# Patient Record
Sex: Female | Born: 1985 | Race: White | Hispanic: No | Smoking: Current every day smoker
Health system: Southern US, Community
[De-identification: ages and names within clinical notes are randomized; demographics above are authoritative.]

## PROBLEM LIST (undated history)

## (undated) DIAGNOSIS — M797 Fibromyalgia: Secondary | ICD-10-CM

## (undated) DIAGNOSIS — E079 Disorder of thyroid, unspecified: Secondary | ICD-10-CM

## (undated) DIAGNOSIS — I2699 Other pulmonary embolism without acute cor pulmonale: Secondary | ICD-10-CM

## (undated) DIAGNOSIS — F1910 Other psychoactive substance abuse, uncomplicated: Secondary | ICD-10-CM

## (undated) DIAGNOSIS — E063 Autoimmune thyroiditis: Secondary | ICD-10-CM

## (undated) DIAGNOSIS — I429 Cardiomyopathy, unspecified: Secondary | ICD-10-CM

## (undated) HISTORY — PX: TONSILLECTOMY: SUR1361

---
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
## 2004-01-05 ENCOUNTER — Emergency Department (HOSPITAL_COMMUNITY): Admission: EM | Admit: 2004-01-05 | Discharge: 2004-01-05 | Payer: Self-pay

## 2004-09-19 ENCOUNTER — Emergency Department (HOSPITAL_COMMUNITY): Admission: EM | Admit: 2004-09-19 | Discharge: 2004-09-19 | Payer: Self-pay | Admitting: Emergency Medicine

## 2005-12-13 ENCOUNTER — Inpatient Hospital Stay (HOSPITAL_COMMUNITY): Admission: AD | Admit: 2005-12-13 | Discharge: 2005-12-13 | Payer: Self-pay | Admitting: *Deleted

## 2006-05-17 ENCOUNTER — Ambulatory Visit (HOSPITAL_COMMUNITY): Admission: RE | Admit: 2006-05-17 | Discharge: 2006-05-17 | Payer: Self-pay | Admitting: Obstetrics & Gynecology

## 2006-07-05 ENCOUNTER — Inpatient Hospital Stay (HOSPITAL_COMMUNITY): Admission: AD | Admit: 2006-07-05 | Discharge: 2006-07-11 | Payer: Self-pay | Admitting: Obstetrics & Gynecology

## 2006-07-16 ENCOUNTER — Inpatient Hospital Stay (HOSPITAL_COMMUNITY): Admission: AD | Admit: 2006-07-16 | Discharge: 2006-07-23 | Payer: Self-pay | Admitting: Obstetrics

## 2006-07-16 ENCOUNTER — Ambulatory Visit: Payer: Self-pay | Admitting: Emergency Medicine

## 2006-07-16 ENCOUNTER — Ambulatory Visit: Payer: Self-pay | Admitting: Internal Medicine

## 2006-08-30 ENCOUNTER — Emergency Department (HOSPITAL_COMMUNITY): Admission: EM | Admit: 2006-08-30 | Discharge: 2006-08-30 | Payer: Self-pay | Admitting: Emergency Medicine

## 2007-04-29 ENCOUNTER — Emergency Department (HOSPITAL_COMMUNITY): Admission: EM | Admit: 2007-04-29 | Discharge: 2007-04-30 | Payer: Self-pay | Admitting: Emergency Medicine

## 2009-09-26 ENCOUNTER — Ambulatory Visit: Payer: Self-pay | Admitting: Diagnostic Radiology

## 2009-09-26 ENCOUNTER — Emergency Department (HOSPITAL_BASED_OUTPATIENT_CLINIC_OR_DEPARTMENT_OTHER): Admission: EM | Admit: 2009-09-26 | Discharge: 2009-09-26 | Payer: Self-pay | Admitting: Emergency Medicine

## 2009-10-03 ENCOUNTER — Emergency Department (HOSPITAL_BASED_OUTPATIENT_CLINIC_OR_DEPARTMENT_OTHER): Admission: EM | Admit: 2009-10-03 | Discharge: 2009-10-03 | Payer: Self-pay | Admitting: Emergency Medicine

## 2009-10-03 ENCOUNTER — Ambulatory Visit: Payer: Self-pay | Admitting: Diagnostic Radiology

## 2011-03-03 LAB — DIFFERENTIAL
Basophils Absolute: 0.2 10*3/uL — ABNORMAL HIGH (ref 0.0–0.1)
Eosinophils Absolute: 0.2 10*3/uL (ref 0.0–0.7)
Eosinophils Relative: 2 % (ref 0–5)
Lymphocytes Relative: 26 % (ref 12–46)
Lymphs Abs: 2.6 10*3/uL (ref 0.7–4.0)
Monocytes Relative: 9 % (ref 3–12)
Neutro Abs: 6.4 10*3/uL (ref 1.7–7.7)
Neutrophils Relative %: 62 % (ref 43–77)

## 2011-03-03 LAB — POCT CARDIAC MARKERS
CKMB, poc: <1 ng/mL — ABNORMAL LOW (ref 1.0–8.0)
Myoglobin, poc: 70.3 ng/mL (ref 12–200)

## 2011-03-03 LAB — BASIC METABOLIC PANEL
BUN: 9 mg/dL (ref 6–23)
CO2: 23 mEq/L (ref 19–32)
Chloride: 101 mEq/L (ref 96–112)
Creatinine, Ser: 0.9 mg/dL (ref 0.4–1.2)
GFR calc non Af Amer: >60 mL/min (ref >60)
Sodium: 139 mEq/L (ref 135–145)

## 2011-03-03 LAB — CBC
HCT: 43.6 % (ref 36.0–46.0)
Hemoglobin: 14.8 g/dL (ref 12.0–15.0)
RBC: 4.84 MIL/uL (ref 3.87–5.11)
RDW: 11.8 % (ref 11.5–15.5)

## 2011-04-15 NOTE — Discharge Summary (Signed)
Linda [name], [name]             ACCOUNT NO.:  [id number]   MEDICAL RECORD NO.:  [id number]          PATIENT TYPE:  INP   LOCATION:  9304                          FACILITY:  [facility]   PHYSICIAN:  Roseanna Rainbow, M.D.DATE OF BIRTH:  November 04, 1986   DATE OF ADMISSION:  07/16/2006  DATE OF DISCHARGE:  07/23/2006                                 DISCHARGE SUMMARY   IDENTIFYING INFORMATION/JUSTIFICATION FOR ADMISSION AND CARE:  The patient  is a 25 year old Caucasian female, status post a spontaneous vaginal  delivery on July 09, 2006, now with chest discomfort and fatigue.   HISTORY OF PRESENT ILLNESS:  The patient is status post vaginal delivery on  July 09, 2006, in the setting of mild PIH. She was recently seen in the  office on July 14, 2006 and was started on hydrochlorothiazide.   PAST SURGICAL HISTORY:  Tonsils and adenoids.   PAST MEDICAL HISTORY:  Depression, anxiety, substance abuse.   MEDICATIONS:  Prenatal vitamins, hydrochlorothiazide, Xanax.   ALLERGIES:  LATEX.   SOCIAL HISTORY:  She is divorced. She has a history of cocaine use. Denies  any tobacco or ethanol use.   PHYSICAL EXAMINATION:  VITAL SIGNS:  Temperature 98.2, pulse 80, respiratory  rate 17. Blood pressure 130 to 140's over 90's to 100's.  LUNGS:  Clear to auscultation bilaterally.  HEART:  Regular rate and rhythm.  ABDOMEN:  Nontender.  PELVIC:  Lochia scant.   LABORATORY DATA:  Hemoglobin 10, platelets 456,000. SGOT and SGPT 18 and 21  respectively. Uric acid 6.2.   ASSESSMENT:  Postpartum with pre-eclampsia.   PLAN:  Admission. Magnesium sulfate seizure prophylaxis.  Chest x-ray.   HOSPITAL COURSE:  The patient was admitted. The initial chest x-ray showed  mild changes of asthma versus bronchitis, without localized consolidation.  She was also started on broad spectrum parenteral antibiotics. At this  point, the patient was complaining of diffuse myalgias including chest  discomfort. She  was found to be hypokalemic, which was repleted. The  magnesium sulfate was continued for 24 hours. On telemetry, she was noted to  have an occasional sinus arrhythmia, which was felt not to be clinically  significant. On July 19, 2006, she complained of worsening chest  discomfort. This was accompanied by hypoxemia. A spiral CT revealed a  pulmonary embolus. She was anticoagulated initially with heparin. The chest  CT also demonstrated pulmonary edema and she was also diuresed with Lasix.  Her blood pressures became labile and her anti-hypertensive regimen was  titrated to control her blood pressures. Pulmonary and critical care were  consulted. The recommendation was to discontinue the heparin and to change  to Lovenox and Coumadin. The pharmacy dosed the Coumadin and the INR levels  were checked daily, for a goal of 2 to 3. [facility] was consulted  as well and it was felt that the patient could be followed as an outpatient  for her sinus arrhythmia, as she was asymptomatic. Her INR was 1.9 on July 23, 2006. On July 23, 2006, the day of discharge, she complained of  worsening chest pain. A chest x-ray, EKG,  CBC, and troponin levels were  repeated. Cardiology was reconsulted. Her troponin was borderline elevated  and it was felt to be secondary to the acute pulmonary embolus. At this  point, the patient and her family were demanding transfer to [facility] and this was arranged as per [facility].   DISCHARGE DIAGNOSES:  1. Severe pre-eclampsia postpartum with secondary pulmonary edema.  2. Acute pulmonary embolus.   CONDITION ON DISCHARGE:  Stable.   DIET:  Regular.   DISCHARGE MEDICATIONS:  Included Coumadin.      Roseanna Rainbow, M.D.  Electronically Signed     LAJ/MEDQ  D:  08/11/2006  T:  08/11/2006  Job:  [id number]
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
## 2011-04-15 NOTE — Consult Note (Signed)
Linda [name], Linda [name]             ACCOUNT NO.:  [id number]   MEDICAL RECORD NO.:  [id number]          PATIENT TYPE:  INP   LOCATION:  9372                          [facility]ILITY[facility]  [facility]   PHYSICIAN:  Pricilla Riffle, MD, FACCDATE OF BIRTH:  1986/09/26   DATE OF CONSULTATION:  07/20/2006  DATE OF DISCHARGE:                                   CONSULTATION   IDENTIFICATION:  Linda [name] is a 25 year old who we are asked to see  regarding arrhythmia.   HISTORY OF PRESENT ILLNESS:  The patient has known skips in the past. No  history of tachycardia or syncope. She was admitted on July 09, 2006  because of increased blood pressures and she underwent induction and  delivery. This again began 4 weeks prior along with edema.  Hydrochlorothiazide was started on July 14, 2006 with some improvement.  However the patient continued to complain of being fatigued, weak, short of  breath. Again, edema still continued, she said it actually got worse.  She  was admitted on July 16, 2006 and given IV Lasix and started on a beta  blocker with improvement.  The patient had chest tightness prior to  admission which continued to not improve. Because of this she underwent  chest CT which showed acute pulmonary embolus. She is now on heparin/Lovenox  and beginning Coumadin.   Again, the patient had a history of skips in the past, no syncope. No  tachycardia.   ALLERGIES:  LATEX.   MEDICATIONS PRIOR TO ADMISSION:  Ibuprofen 600 p.r.n., Xanax p.r.n.,  hydrochlorothiazide 12.5 daily.   CURRENT MEDICATIONS:  Azithromycin, Xanax 0.5 t.i.d., Protonix 40 b.i.d.,  labetalol 200 b.i.d., Lovenox 75 q.12 hours, Coumadin as directed, Rocephin.   PAST MEDICAL HISTORY:  1. G1, P1.  2. Hypertension postpartum.  3. Seasonal allergies.  4. Depression/anxiety; history of self inflicted injury.   SOCIAL HISTORY:  The patient lives in [place]. She is divorced. Quit  tobacco Apr 04, 2006. Does not drink. Used cocaine in  the past, last in  November, 2006.   FAMILY HISTORY:  Mother is alive at age 71, has a history of pulmonary  embolus, took herself off Coumadin. Father died suddenly at age 20, was a  smoker, also had abnormal lipids. The patient has one brother, two sisters.  Note - family history positive for hypercoagulability.   REVIEW OF SYSTEMS:  All systems reviewed, negative to the above problem  except as noted above.   PHYSICAL EXAMINATION:  GENERAL:  The patient is currently in no distress.  Denies shortness of breath at present.  VITAL SIGNS:  Blood pressure 124/70 to 90's. Pulse is 84 and regular.  Temperature is 98.2. Oxygen saturation on room air is 99%.  I&Os this admission are 10,500 in, 15,500 out.  HEENT:  Normocephalic, atraumatic. PERRL.  NECK:  Supple, no JVD.  LUNGS:  Clear.  CARDIAC EXAM:  Regular rate and rhythm S1, S2, no S3, S4 or murmurs noted.  ABDOMEN:  No hepatosplenomegaly. Supple. Normal bowel sounds.  EXTREMITIES:  Good distal pulses, no peripheral edema.  NEURO:  Alert and oriented x3. Cranial  nerves II-XII intact. Motor exam 5-5  throughout.   Chest x-ray done August 19 - normal cardiac silhouette, pulmonary edema and  on August 21 - worsening pulmonary edema. Chest CT - bilateral  Lower lobe pulmonary emboli with pleural effusions.  On August 22 - improved  aeration. A 12-lead EKG - normal sinus rhythm, 89 beats per minute. Normal  conduction intervals. Telemetry shows sinus rhythm, PACs, PVCs isolated.  Occasional junctional beat.   LABORATORY DATA:  Significant for a hemoglobin of 10.9 thousand, WBC of  8600. BUN and creatinine 11 and 0.8, potassium of 3.4. Albumin 3.6.   The patient is a 25 year old now postpartum since July 09, 2006. Pre-  delivery developed hypertension with edema, this got worse, continued after  delivery, developed shortness of breath, chest pressure; admitted on July 16, 2006. She was diuresed some, blood pressure treated. She  continued to be  symptomatic and a CT was positive for pulmonary embolus.  We are asked to  see regarding the PACs on telemetry, again there are isolated skips, PACs,  PVCs.  No sustained or non sustained tachycardia on exam. No evidence of  congestive heart failure. Chest x-ray normal sinus rhythm with normal  conduction intervals.   IMPRESSION:  Isolated PACs, PVCs. Would check TSH. Keep potassium greater  than 4. Treat hypertension, labetalol should help skips. Patient is without  hemodynamic or symptomatic complaints. No further treatment unless changes  or patient becomes symptomatic.   Would hold off on further testing for now. Would follow up in clinic in 4  weeks to check blood pressure. Patient needs lipids at some point with  family history, again continue Coumadin.           ______________________________  Pricilla Riffle, MD, [facility]     PVR/MEDQ  D:  07/20/2006  T:  07/20/2006  Job:  [id number]

## 2011-09-15 LAB — I-STAT 8, (EC8 V) (CONVERTED LAB)
BUN: 7
Bicarbonate: 26 — ABNORMAL HIGH
HCT: 48 — ABNORMAL HIGH
Hemoglobin: 16.3 — ABNORMAL HIGH
Sodium: 140

## 2011-09-15 LAB — RAPID URINE DRUG SCREEN, HOSP PERFORMED
Amphetamines: NOT DETECTED
Barbiturates: NOT DETECTED
Benzodiazepines: NOT DETECTED

## 2011-09-15 LAB — POCT I-STAT CREATININE: Creatinine, Ser: 1

## 2011-09-15 LAB — POCT PREGNANCY, URINE: Operator id: 196461

## 2020-01-24 ENCOUNTER — Emergency Department (HOSPITAL_COMMUNITY)
Admission: EM | Admit: 2020-01-24 | Discharge: 2020-01-25 | Disposition: A | Discharge status: Home / Self Care | Payer: Self-pay | Attending: Emergency Medicine | Admitting: Emergency Medicine

## 2020-01-24 ENCOUNTER — Other Ambulatory Visit: Payer: Self-pay

## 2020-01-24 ENCOUNTER — Encounter (HOSPITAL_COMMUNITY): Payer: Self-pay

## 2020-01-24 DIAGNOSIS — F1920 Other psychoactive substance dependence, uncomplicated: Secondary | ICD-10-CM | POA: Insufficient documentation

## 2020-01-24 DIAGNOSIS — Z20822 Contact with and (suspected) exposure to covid-19: Secondary | ICD-10-CM | POA: Insufficient documentation

## 2020-01-24 DIAGNOSIS — F17210 Nicotine dependence, cigarettes, uncomplicated: Secondary | ICD-10-CM | POA: Insufficient documentation

## 2020-01-24 DIAGNOSIS — F19959 Other psychoactive substance use, unspecified with psychoactive substance-induced psychotic disorder, unspecified: Secondary | ICD-10-CM

## 2020-01-24 DIAGNOSIS — F4310 Post-traumatic stress disorder, unspecified: Secondary | ICD-10-CM | POA: Insufficient documentation

## 2020-01-24 DIAGNOSIS — Z86711 Personal history of pulmonary embolism: Secondary | ICD-10-CM | POA: Insufficient documentation

## 2020-01-24 DIAGNOSIS — I429 Cardiomyopathy, unspecified: Secondary | ICD-10-CM | POA: Insufficient documentation

## 2020-01-24 HISTORY — DX: Cardiomyopathy, unspecified: I42.9

## 2020-01-24 HISTORY — DX: Other psychoactive substance abuse, uncomplicated: F19.10

## 2020-01-24 HISTORY — DX: Other pulmonary embolism without acute cor pulmonale: I26.99

## 2020-01-24 LAB — COMPREHENSIVE METABOLIC PANEL
ALT: 95 U/L — ABNORMAL HIGH (ref 0–44)
AST: 41 U/L (ref 15–41)
Albumin: 4.3 g/dL (ref 3.5–5.0)
Alkaline Phosphatase: 88 U/L (ref 38–126)
Anion gap: 12 (ref 5–15)
BUN: 16 mg/dL (ref 6–20)
CO2: 25 mmol/L (ref 22–32)
Calcium: 9.3 mg/dL (ref 8.9–10.3)
Chloride: 102 mmol/L (ref 98–111)
Creatinine, Ser: 0.82 mg/dL (ref 0.44–1.00)
GFR calc Af Amer: >60 mL/min (ref >60)
GFR calc non Af Amer: >60 mL/min (ref >60)
Glucose, Bld: 106 mg/dL — ABNORMAL HIGH (ref 70–99)
Potassium: 3.2 mmol/L — ABNORMAL LOW (ref 3.5–5.1)
Sodium: 139 mmol/L (ref 135–145)
Total Bilirubin: 1 mg/dL (ref 0.3–1.2)
Total Protein: 8.3 g/dL — ABNORMAL HIGH (ref 6.5–8.1)

## 2020-01-24 LAB — CBC WITH DIFFERENTIAL/PLATELET
Abs Immature Granulocytes: 0.04 10*3/uL (ref 0.00–0.07)
Basophils Absolute: 0.1 10*3/uL (ref 0.0–0.1)
Basophils Relative: 1 %
Eosinophils Absolute: 0.2 10*3/uL (ref 0.0–0.5)
Eosinophils Relative: 2 %
HCT: 39.5 % (ref 36.0–46.0)
Hemoglobin: 13.4 g/dL (ref 12.0–15.0)
Immature Granulocytes: 0 %
Lymphocytes Relative: 27 %
Lymphs Abs: 2.9 10*3/uL (ref 0.7–4.0)
MCH: 30.2 pg (ref 26.0–34.0)
MCHC: 33.9 g/dL (ref 30.0–36.0)
MCV: 89 fL (ref 80.0–100.0)
Monocytes Absolute: 1 10*3/uL (ref 0.1–1.0)
Monocytes Relative: 9 %
Neutro Abs: 6.4 10*3/uL (ref 1.7–7.7)
Neutrophils Relative %: 61 %
Platelets: 363 10*3/uL (ref 150–400)
RBC: 4.44 MIL/uL (ref 3.87–5.11)
RDW: 13.4 % (ref 11.5–15.5)
WBC: 10.6 10*3/uL — ABNORMAL HIGH (ref 4.0–10.5)
nRBC: 0 % (ref 0.0–0.2)

## 2020-01-24 LAB — RAPID URINE DRUG SCREEN, HOSP PERFORMED
Amphetamines: POSITIVE — AB
Barbiturates: NOT DETECTED
Benzodiazepines: POSITIVE — AB
Cocaine: NOT DETECTED
Opiates: NOT DETECTED
Tetrahydrocannabinol: POSITIVE — AB

## 2020-01-24 LAB — PREGNANCY, URINE: Preg Test, Ur: NEGATIVE

## 2020-01-24 LAB — ETHANOL: Alcohol, Ethyl (B): <10 mg/dL (ref <10)

## 2020-01-24 MED ORDER — NICOTINE 21 MG/24HR TD PT24: 21.0000 mg | MEDICATED_PATCH | Freq: Every day | TRANSDERMAL | Status: DC

## 2020-01-24 MED ORDER — ALUM & MAG HYDROXIDE-SIMETH 200-200-20 MG/5ML PO SUSP: 30.0000 mL | Freq: Four times a day (QID) | ORAL | Status: DC | PRN

## 2020-01-24 MED ORDER — POTASSIUM CHLORIDE CRYS ER 20 MEQ PO TBCR
40.0000 meq | EXTENDED_RELEASE_TABLET | Freq: Once | ORAL | Status: AC
  Administered 2020-01-25: 40 meq via ORAL
  Filled 2020-01-24: qty 2

## 2020-01-24 MED ORDER — ZOLPIDEM TARTRATE 5 MG PO TABS
5.0000 mg | ORAL_TABLET | Freq: Every evening | ORAL | Status: DC | PRN
  Administered 2020-01-25: 5 mg via ORAL
  Filled 2020-01-24: qty 1

## 2020-01-24 MED ORDER — ONDANSETRON HCL 4 MG PO TABS: 4.0000 mg | ORAL_TABLET | Freq: Three times a day (TID) | ORAL | Status: DC | PRN

## 2020-01-24 MED ORDER — ACETAMINOPHEN 325 MG PO TABS: 650.0000 mg | ORAL_TABLET | ORAL | Status: DC | PRN
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
## 2020-01-24 NOTE — ED Notes (Signed)
ONE BELONGINGS BAG PLACED IN LOCKER 27. 

## 2020-01-24 NOTE — ED Notes (Signed)
Denies SI/HI. Prefers to leave.  Sandwich and drink given. Pt reports that she has not eaten in a few days.

## 2020-01-24 NOTE — ED Notes (Signed)
Attempted blood draw x2 unsuccessful 

## 2020-01-24 NOTE — Progress Notes (Signed)
Linda Murdoch, NP states the pt does not meet criteria for inpt tx and is recommended for a peer support consult as the pt's presenting concerns appear to be directly related to SA. Pt denies SI, HI, and AVH. EDP Fayrene Helper, PA-C and Antionette Char, RN have been advised.

## 2020-01-24 NOTE — SANE Note (Signed)
The SANE/FNE (Forensic Nurse Examiner) consult has been completed. The primary or charge RN and physician have been notified. Please contact the SANE/FNE nurse on call (listed in Amion) with any further concerns.  

## 2020-01-24 NOTE — ED Notes (Signed)
Patient spoke to SANE nurse over the phone
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
## 2020-01-24 NOTE — ED Provider Notes (Signed)
[facility] DEPT Provider Note   CSN: [id number] Arrival date & time: 01/24/20  1744     History No chief complaint on file.   Linda [name] is a 34 y.o. female.  The history is provided by the patient. No language interpreter was used.     34 year old with significant history of polysubstance abuse presenting with concerns of human trafficking.  Patient reports she just abuse meth via IV.  She is homeless.  She recently was incarcerated and was released.  She mention staying at a house for the past 3 to 4 days and she feels that she is being prepped for human trafficked.  She report receiving several small pills from a person that she recently met.  She also report injecting some substance a few days ago and had a black out and could not remember much.  She did relapse on meth, last use was yesterday and marijuana use today.  Remote hx of heroin use.  She mentioned the people that she stays with is grooming her (shower her, wash her feet, taking down her measurement) and she is afraid of being sex trafficking.  She was able to escape from a car ride today, ran to a convenient store and was able to call the sheriff to bring her here.  History however is limited as patient is incoherent in her speech and a poor historian.    Past Medical History:  Diagnosis Date  . Cardiomyopathy ([place])   . Polysubstance abuse ([place])   . Pulmonary embolism ([facility])     There are no problems to display for this patient.   Past Surgical History:  Procedure Laterality Date  . TONSILLECTOMY       OB History   No obstetric history on file.     Family History  Family history unknown: Yes    Social History   Tobacco Use  . Smoking status: Current Every Day Smoker    Packs/day: 0.50    Types: Cigarettes  . Smokeless tobacco: Never Used  Substance Use Topics  . Alcohol use: Never  . Drug use: Yes    Types: Methamphetamines, Marijuana    Home  Medications Prior to Admission medications   Not on File    Allergies    Latex  Review of Systems   Review of Systems  Unable to perform ROS: Mental status change    Physical Exam Updated Vital Signs BP (!) 139/95 (BP Location: Left Arm)   Pulse (!) 118   Temp 97.8 F (36.6 C) (Oral)   Resp 16   Ht 5' 7"  (1.702 m)   Wt 68 kg   LMP 01/08/2020   SpO2 99%   BMI 23.49 kg/m   Physical Exam Vitals and nursing note reviewed.  Constitutional:      General: She is not in acute distress.    Appearance: She is well-developed.     Comments: Patient is sitting in bed, eating [place] sandwich, appearing mildly anxious.  HENT:     Head: Atraumatic.  Eyes:     Conjunctiva/sclera: Conjunctivae normal.  Cardiovascular:     Rate and Rhythm: Tachycardia present.     Pulses: Normal pulses.     Heart sounds: Normal heart sounds.  Pulmonary:     Effort: Pulmonary effort is normal.     Breath sounds: Normal breath sounds.  Abdominal:     Palpations: Abdomen is soft.     Tenderness: There is no abdominal tenderness.  Musculoskeletal:  Cervical back: Neck supple.  Skin:    Findings: No rash.  Neurological:     Mental Status: She is alert.     GCS: GCS eye subscore is 4. GCS verbal subscore is 5. GCS motor subscore is 6.     Cranial Nerves: Cranial nerves are intact.     Motor: No weakness.  Psychiatric:        Attention and Perception: She is inattentive.        Mood and Affect: Affect is labile.        Speech: Speech is tangential.        Behavior: Behavior is cooperative.        Thought Content: Thought content is paranoid and delusional. Thought content does not include homicidal or suicidal ideation.        Cognition and Memory: Cognition is impaired.     ED Results / Procedures / Treatments   Labs (all labs ordered are listed, but only abnormal results are displayed) Labs Reviewed  COMPREHENSIVE METABOLIC PANEL - Abnormal; Notable for the following components:       Result Value   Potassium 3.2 (*)    Glucose, Bld 106 (*)    Total Protein 8.3 (*)    ALT 95 (*)    All other components within normal limits  RAPID URINE DRUG SCREEN, HOSP PERFORMED - Abnormal; Notable for the following components:   Benzodiazepines POSITIVE (*)    Amphetamines POSITIVE (*)    Tetrahydrocannabinol POSITIVE (*)    All other components within normal limits  CBC WITH DIFFERENTIAL/PLATELET - Abnormal; Notable for the following components:   WBC 10.6 (*)    All other components within normal limits  RESPIRATORY PANEL BY RT PCR (FLU A&B, COVID)  ETHANOL  PREGNANCY, URINE    EKG None  Radiology No results found.  Procedures Procedures (including critical care time)  Medications Ordered in ED Medications  acetaminophen (TYLENOL) tablet 650 mg (has no administration in time range)  zolpidem (AMBIEN) tablet 5 mg (has no administration in time range)  ondansetron (ZOFRAN) tablet 4 mg (has no administration in time range)  alum & mag hydroxide-simeth (MAALOX/MYLANTA) 200-200-20 MG/5ML suspension 30 mL (has no administration in time range)  nicotine (NICODERM CQ - dosed in mg/24 hours) patch 21 mg (has no administration in time range)  potassium chloride SA (KLOR-CON) CR tablet 40 mEq (has no administration in time range)    ED Course  I have reviewed the triage vital signs and the nursing notes.  Pertinent labs & imaging results that were available during my care of the patient were reviewed by me and considered in my medical decision making (see chart for details).    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  She believes people are listening in her phone,  and that someone have wiped out info in her phone.  She also report her "friends" of whom she recently met is trying to groom her for potential trafficking.  She also endorse recent drug use, and lapse of memories.  When asked if she have been involved in sex trafficking pt report she doesn't know.    She denies SI/HI/AVH. I have reached out to our SANE nurse who have spent over 1 hr talking and obtaining history from the patient.  It was felt that pt is experiencing paranoia, likely 2/2 psychosis from substance use, and less likely a victim from human trafficking. However, due to difficulty obtaining a cohesive history, Plan to monitor patient over night and reassess tomorrow as pt is a poor historian and may not be safe to be discharge at this time.   10:33 PM Suspect drug induced psychosis.  Will request TTS to evaluate pt.  Plan for obs overnight and SANE Rodney Cruise) nurse will evaluate pt tomorrow morning to obtain a more accurate story.    11:54 PM Pt sign out to oncoming provider who will reassess pt tomorrow. TTS did evaluate pt and felt pt does not meet criteria for inpt treatment and is recommended for a peer support consult due to substance abuse.       Domenic Moras, PA-C 01/24/20 Joie Bimler    Davonna Belling, MD 01/30/20 [phone]
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
## 2020-01-25 DIAGNOSIS — F1920 Other psychoactive substance dependence, uncomplicated: Secondary | ICD-10-CM

## 2020-01-25 DIAGNOSIS — F4310 Post-traumatic stress disorder, unspecified: Secondary | ICD-10-CM

## 2020-01-25 LAB — RESPIRATORY PANEL BY RT PCR (FLU A&B, COVID)
Influenza A by PCR: NEGATIVE
Influenza B by PCR: NEGATIVE
SARS Coronavirus 2 by RT PCR: NEGATIVE

## 2020-01-25 MED ORDER — TRAZODONE HCL 50 MG PO TABS: 50.0000 mg | ORAL_TABLET | Freq: Every evening | ORAL | Status: DC | PRN

## 2020-01-25 NOTE — ED Provider Notes (Signed)
Patient needs to be reassessed in AM by SANE after sobering up to get better idea if human trafficking is a risk.    Notified oncoming team of the plan, Lawyer, PA-C.   Roxy Horseman, PA-C 01/25/20 [id number]    Gilda Crease, MD 01/25/20 [phone]

## 2020-01-25 NOTE — ED Notes (Signed)
Found syringe was wasted with charge nurse Elpidio Galea.
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
## 2020-01-25 NOTE — ED Notes (Signed)
During AM vital NT Jean Rosenthal went in the patient's room and stepped on a syringe on the floor. The syringe has clear unknown liquid with a broken needle at the tip. Searched patient and the room for the missing needle.  Patient did not answer what is in the syringe when asked.  No needle was found in the room and her personal belonging. Patient is very sleeping during the whole time.  Syringe placed in the chart while consult with off duty [employer].

## 2020-01-25 NOTE — Patient Outreach (Signed)
[facility]PSS met with the patient in the [place] in order to provide substance use recovery support and provide help with getting connected to substance use recovery resources. Patient reports a history of current poly substance use including cannabis use and methamphetamine use. Patient reports a past history of heroin use with her last use being in November 2020. Patient plans to look over resources provided by [facility]PSS for follow up for substance use recovery help. CPSS talked to the patient about several different options for substance use recovery help including residential substance use treatment with [facility] and [facility] as well as [employer] [facility] and available online/in-person [facility] in [place]. CPSS provided follow up information for all these resources including residential/outpatient substance use treatment center list, online/in-person NA meeting list, [facility] list/flier detailing [facility] services, and [facility]PSS contact information. CPSS strongly encouraged the patient to follow up with [facility] if needed for further help getting connected to substance use recovery resources or any other support with [facility] substance use recovery outreach services. Patient is passionate about her substance use recovery process, and thanked CPSS for the [facility]PSS services provided.

## 2021-01-15 ENCOUNTER — Emergency Department (HOSPITAL_BASED_OUTPATIENT_CLINIC_OR_DEPARTMENT_OTHER): Payer: Self-pay

## 2021-01-15 ENCOUNTER — Encounter (HOSPITAL_BASED_OUTPATIENT_CLINIC_OR_DEPARTMENT_OTHER): Payer: Self-pay | Admitting: Emergency Medicine

## 2021-01-15 ENCOUNTER — Other Ambulatory Visit: Payer: Self-pay

## 2021-01-15 ENCOUNTER — Emergency Department (HOSPITAL_BASED_OUTPATIENT_CLINIC_OR_DEPARTMENT_OTHER)
Admission: EM | Admit: 2021-01-15 | Discharge: 2021-01-15 | Disposition: A | Discharge status: Home / Self Care | Payer: Self-pay | Attending: Emergency Medicine | Admitting: Emergency Medicine

## 2021-01-15 DIAGNOSIS — R109 Unspecified abdominal pain: Secondary | ICD-10-CM

## 2021-01-15 DIAGNOSIS — F17210 Nicotine dependence, cigarettes, uncomplicated: Secondary | ICD-10-CM | POA: Insufficient documentation

## 2021-01-15 DIAGNOSIS — Z91040 Latex allergy status: Secondary | ICD-10-CM | POA: Insufficient documentation

## 2021-01-15 DIAGNOSIS — N3000 Acute cystitis without hematuria: Secondary | ICD-10-CM | POA: Insufficient documentation

## 2021-01-15 DIAGNOSIS — R112 Nausea with vomiting, unspecified: Secondary | ICD-10-CM | POA: Insufficient documentation

## 2021-01-15 HISTORY — DX: Fibromyalgia: M79.7

## 2021-01-15 HISTORY — DX: Autoimmune thyroiditis: E06.3

## 2021-01-15 HISTORY — DX: Disorder of thyroid, unspecified: E07.9

## 2021-01-15 LAB — COMPREHENSIVE METABOLIC PANEL
ALT: 30 U/L (ref 0–44)
AST: 30 U/L (ref 15–41)
Albumin: 3.4 g/dL — ABNORMAL LOW (ref 3.5–5.0)
Alkaline Phosphatase: 90 U/L (ref 38–126)
Anion gap: 10 (ref 5–15)
BUN: 14 mg/dL (ref 6–20)
CO2: 26 mmol/L (ref 22–32)
Calcium: 9.7 mg/dL (ref 8.9–10.3)
Chloride: 98 mmol/L (ref 98–111)
Creatinine, Ser: 0.84 mg/dL (ref 0.44–1.00)
GFR, Estimated: >60 mL/min (ref >60)
Glucose, Bld: 113 mg/dL — ABNORMAL HIGH (ref 70–99)
Potassium: 3.5 mmol/L (ref 3.5–5.1)
Sodium: 134 mmol/L — ABNORMAL LOW (ref 135–145)
Total Bilirubin: 0.4 mg/dL (ref 0.3–1.2)
Total Protein: 7.4 g/dL (ref 6.5–8.1)

## 2021-01-15 LAB — CBC
HCT: 45.7 % (ref 36.0–46.0)
Hemoglobin: 15.6 g/dL — ABNORMAL HIGH (ref 12.0–15.0)
MCH: 28.7 pg (ref 26.0–34.0)
MCHC: 34.1 g/dL (ref 30.0–36.0)
MCV: 84.2 fL (ref 80.0–100.0)
Platelets: 379 10*3/uL (ref 150–400)
RBC: 5.43 MIL/uL — ABNORMAL HIGH (ref 3.87–5.11)
RDW: 13.5 % (ref 11.5–15.5)
WBC: 10.5 10*3/uL (ref 4.0–10.5)
nRBC: 0 % (ref 0.0–0.2)

## 2021-01-15 LAB — LIPASE, BLOOD: Lipase: 21 U/L (ref 11–51)

## 2021-01-15 LAB — URINALYSIS, ROUTINE W REFLEX MICROSCOPIC
Glucose, UA: NEGATIVE mg/dL
Ketones, ur: NEGATIVE mg/dL
Leukocytes,Ua: NEGATIVE
Nitrite: NEGATIVE
Protein, ur: NEGATIVE mg/dL
Specific Gravity, Urine: 1.03 (ref 1.005–1.030)
pH: 6 (ref 5.0–8.0)

## 2021-01-15 LAB — URINALYSIS, MICROSCOPIC (REFLEX)

## 2021-01-15 LAB — PROTIME-INR
INR: 1.1 (ref 0.8–1.2)
Prothrombin Time: 13.9 seconds (ref 11.4–15.2)

## 2021-01-15 LAB — PREGNANCY, URINE: Preg Test, Ur: NEGATIVE

## 2021-01-15 LAB — LACTIC ACID, PLASMA
Lactic Acid, Venous: 1.5 mmol/L (ref 0.5–1.9)
Lactic Acid, Venous: 1.4 mmol/L (ref 0.5–1.9)

## 2021-01-15 MED ORDER — CEPHALEXIN 500 MG PO CAPS: 500.0000 mg | ORAL_CAPSULE | Freq: Three times a day (TID) | ORAL | 0 refills | Status: AC

## 2021-01-15 MED ORDER — ONDANSETRON 8 MG PO TBDP: 8.0000 mg | ORAL_TABLET | Freq: Three times a day (TID) | ORAL | 0 refills | Status: DC | PRN

## 2021-01-15 MED ORDER — SODIUM CHLORIDE 0.9 % IV BOLUS (SEPSIS)
1000.0000 mL | Freq: Once | INTRAVENOUS | Status: AC
  Administered 2021-01-15: 1000 mL via INTRAVENOUS

## 2021-01-15 MED ORDER — IOHEXOL 300 MG/ML  SOLN
100.0000 mL | Freq: Once | INTRAMUSCULAR | Status: AC | PRN
  Administered 2021-01-15: 100 mL via INTRAVENOUS

## 2021-01-15 MED ORDER — MORPHINE SULFATE (PF) 4 MG/ML IV SOLN
4.0000 mg | Freq: Once | INTRAVENOUS | Status: AC
  Administered 2021-01-15: 4 mg via INTRAVENOUS
  Filled 2021-01-15: qty 1

## 2021-01-15 MED ORDER — ONDANSETRON HCL 4 MG/2ML IJ SOLN
4.0000 mg | Freq: Once | INTRAMUSCULAR | Status: AC
  Administered 2021-01-15: 4 mg via INTRAVENOUS
  Filled 2021-01-15: qty 2

## 2021-01-15 MED ORDER — SODIUM CHLORIDE 0.9 % IV SOLN
1000.0000 mL | INTRAVENOUS | Status: DC
  Administered 2021-01-15: 1000 mL via INTRAVENOUS

## 2021-01-15 MED ORDER — SODIUM CHLORIDE 0.9 % IV SOLN
1.0000 g | Freq: Once | INTRAVENOUS | Status: AC
  Administered 2021-01-15: 1 g via INTRAVENOUS
  Filled 2021-01-15: qty 10

## 2021-01-15 NOTE — ED Notes (Signed)
US at bedside

## 2021-01-15 NOTE — ED Notes (Signed)
Pt drinking contrast in bed; nad noted.

## 2021-01-15 NOTE — ED Notes (Signed)
Pt discharged home after verbalizing understanding of discharge instructions; nad noted.   Pt upset that her diagnosis was UTI, states she knows she has something else. Pt advised to take her antibiotics and follow up with listed practice. Pt agreed.

## 2021-01-15 NOTE — ED Notes (Signed)
Pt incontinent of stool. Pt given washcloths and towels, soap to clean herself up.

## 2021-01-15 NOTE — ED Notes (Signed)
Pt is tearful, states she is currently homeless and does not feel safe where she was living.

## 2021-01-15 NOTE — Discharge Instructions (Addendum)
Take the medication as prescribed for nausea and infection.  Follow-up with primary care doctor to be rechecked.

## 2021-01-15 NOTE — ED Triage Notes (Addendum)
Pt reports abd pain, N/V/D, abd distention x 1 week. Also reports she is an IV drug user and last used heroin at 3am.

## 2021-01-15 NOTE — ED Notes (Signed)
Pt returned from CT °

## 2021-01-15 NOTE — ED Notes (Signed)
Pt back and forth to bedside commode x 4 while this nurse in the room obtaining blood cultures and starting second line.

## 2021-01-15 NOTE — ED Provider Notes (Signed)
[facility] EMERGENCY DEPARTMENT Provider Note   CSN: [id number] Arrival date & time: 01/15/21  5366     History Chief Complaint  Patient presents with  . Abdominal Pain    Linda [name] is a 35 y.o. female.  HPI   Patient presents to the ED for evaluation of abdominal pain.  Patient has a history of multiple medical problems including polysubstance abuse, pulmonary embolism, sepsis and abscess.  Patient states in the last week she has had issues with nausea vomiting and abdominal swelling.  Patient states initially she noticed she was having increased burping and belching especially at night.  The odor was foul.  Over the past week the symptoms have increased in frequency.  She started having nausea and vomiting.  She vomited at least 3 times since last night.  Patient has had some constipation with small caliber stools.  She denies any diarrhea or dysuria.  Patient noted in the last 3 days she has had abdominal swelling.  Patient has had irregular menses with her last normal period in September.  Past Medical History:  Diagnosis Date  . Cardiomyopathy (HCC)   . Fibromyalgia   . Hashimoto's disease   . Polysubstance abuse (HCC)   . Pulmonary embolism (HCC)   . Thyroid disease     Patient Active Problem List   Diagnosis Date Noted  . Posttraumatic stress disorder 01/25/2020  . Polysubstance dependence (HCC) 01/25/2020    Past Surgical History:  Procedure Laterality Date  . TONSILLECTOMY       OB History   No obstetric history on file.     Family History  Family history unknown: Yes    Social History   Tobacco Use  . Smoking status: Current Every Day Smoker    Packs/day: 0.50    Types: Cigarettes  . Smokeless tobacco: Never Used  Vaping Use  . Vaping Use: Never used  Substance Use Topics  . Alcohol use: Never  . Drug use: Yes    Types: Methamphetamines, Marijuana    Comment: IV drug use    Home Medications Prior to Admission medications    Medication Sig Start Date End Date Taking? Authorizing Provider  cephALEXin (KEFLEX) 500 MG capsule Take 1 capsule (500 mg total) by mouth 3 (three) times daily for 7 days. 01/15/21 01/22/21 Yes Linwood Dibbles, MD  ondansetron (ZOFRAN ODT) 8 MG disintegrating tablet Take 1 tablet (8 mg total) by mouth every 8 (eight) hours as needed for nausea or vomiting. 01/15/21  Yes Linwood Dibbles, MD  acetaminophen (TYLENOL) 500 MG tablet Take 500-1,000 mg by mouth every 6 (six) hours as needed for mild pain or moderate pain.    [provider]  ibuprofen (ADVIL) 200 MG tablet Take 200-800 mg by mouth every 6 (six) hours as needed for mild pain or moderate pain.    [provider]    Allergies    Latex  Review of Systems   Review of Systems  All other systems reviewed and are negative.   Physical Exam Updated Vital Signs BP 116/64   Pulse 87   Temp 98.4 F (36.9 C) (Oral)   Resp 11   Ht 1.702 m (5\' 7" )   Wt 59.3 kg   LMP 01/15/2021   SpO2 97%   BMI 20.49 kg/m   Physical Exam Vitals and nursing note reviewed.  Constitutional:      General: She is not in acute distress.    Appearance: She is well-developed and well-nourished.  HENT:  Head: Normocephalic and atraumatic.     Right Ear: External ear normal.     Left Ear: External ear normal.  Eyes:     General: No scleral icterus.       Right eye: No discharge.        Left eye: No discharge.     Conjunctiva/sclera: Conjunctivae normal.  Neck:     Trachea: No tracheal deviation.  Cardiovascular:     Rate and Rhythm: Normal rate and regular rhythm.     Pulses: Intact distal pulses.  Pulmonary:     Effort: Pulmonary effort is normal. No respiratory distress.     Breath sounds: Normal breath sounds. No stridor. No wheezing or rales.  Abdominal:     General: Abdomen is protuberant. Bowel sounds are normal. There is distension.     Palpations: Abdomen is soft. There is no shifting dullness or fluid wave.     Tenderness:  There is generalized abdominal tenderness. There is no guarding or rebound.     Hernia: No hernia is present.  Musculoskeletal:        General: No tenderness or edema.     Cervical back: Neck supple.  Skin:    General: Skin is warm and dry.     Findings: No rash.  Neurological:     Mental Status: She is alert.     Cranial Nerves: No cranial nerve deficit (no facial droop, extraocular movements intact, no slurred speech).     Sensory: No sensory deficit.     Motor: No abnormal muscle tone or seizure activity.     Coordination: Coordination normal.     Deep Tendon Reflexes: Strength normal.  Psychiatric:        Mood and Affect: Mood and affect normal.     ED Results / Procedures / Treatments   Labs (all labs ordered are listed, but only abnormal results are displayed) Labs Reviewed  COMPREHENSIVE METABOLIC PANEL - Abnormal; Notable for the following components:      Result Value   Sodium 134 (*)    Glucose, Bld 113 (*)    Albumin 3.4 (*)    All other components within normal limits  CBC - Abnormal; Notable for the following components:   RBC 5.43 (*)    Hemoglobin 15.6 (*)    All other components within normal limits  URINALYSIS, ROUTINE W REFLEX MICROSCOPIC - Abnormal; Notable for the following components:   Hgb urine dipstick LARGE (*)    Bilirubin Urine SMALL (*)    All other components within normal limits  URINALYSIS, MICROSCOPIC (REFLEX) - Abnormal; Notable for the following components:   Bacteria, UA MANY (*)    All other components within normal limits  CULTURE, BLOOD (ROUTINE X 2)  CULTURE, BLOOD (ROUTINE X 2)  LIPASE, BLOOD  PREGNANCY, URINE  LACTIC ACID, PLASMA  LACTIC ACID, PLASMA  PROTIME-INR    EKG None  Radiology CT ABDOMEN PELVIS W CONTRAST  Result Date: 01/15/2021 CLINICAL DATA:  Abdominal distension, abdominal pain, nausea, vomiting, and diarrhea for 1 week EXAM: CT ABDOMEN AND PELVIS WITH CONTRAST TECHNIQUE: Multidetector CT imaging of the  abdomen and pelvis was performed using the standard protocol following bolus administration of intravenous contrast. Sagittal and coronal MPR images reconstructed from axial data set. CONTRAST:  100mL OMNIPAQUE IOHEXOL 300 MG/ML SOLN IV. No oral contrast. COMPARISON:  None FINDINGS: Lower chest: Minimal dependent atelectasis at RIGHT lung base. LEFT lung base clear. Hepatobiliary: Gallbladder appears mildly distended without gross wall thickening or  calcification by CT. No biliary dilatation. Liver normal appearance. Pancreas: Normal appearance Spleen: Normal appearance Adrenals/Urinary Tract: Adrenal glands normal appearance. Small BILATERAL renal cysts. Kidneys, ureters, and bladder normal appearance Stomach/Bowel: Appendix not definitely visualized but no pericecal inflammatory process is seen. Stomach decompressed. Large and small bowel loops unremarkable for exam lacking GI contrast. Vascular/Lymphatic: Scattered pelvic phleboliths. Aorta normal caliber. Vascular structures patent. Reproductive: Unremarkable uterus and adnexa Other: No free air or free fluid. No hernia or infra phlegm a tori process. Musculoskeletal: Unremarkable IMPRESSION: Mildly distended gallbladder without gross wall thickening or calcification by CT; if there is clinical concern for cholelithiasis or acute cholecystitis, recommend ultrasound assessment. Small BILATERAL renal cysts. No other intra-abdominal or intrapelvic abnormalities identified. Electronically Signed   By: Ulyses Southward M.D.   On: 01/15/2021 13:06   US Abdomen Limited RUQ (LIVER/GB)  Result Date: 01/15/2021 CLINICAL DATA:  Abdominal pain, distention and nausea for approximately 1 week. EXAM: ULTRASOUND ABDOMEN LIMITED RIGHT UPPER QUADRANT COMPARISON:  CT abdomen and pelvis 01/15/2021. FINDINGS: Gallbladder: No gallstones or wall thickening visualized. A moderate volume of sludge is seen in the gallbladder. No sonographic Murphy sign noted by sonographer. Common bile  duct: Diameter: 0.2 cm Liver: No focal lesion identified. Within normal limits in parenchymal echogenicity. Portal vein is patent on color Doppler imaging with normal direction of blood flow towards the liver. Other: None. IMPRESSION: Gallbladder sludge.  Negative for stones or cholecystitis. Electronically Signed   By: Drusilla Kanner M.D.   On: 01/15/2021 14:54    Procedures Procedures   Medications Ordered in ED Medications  sodium chloride 0.9 % bolus 1,000 mL (0 mLs Intravenous Stopped 01/15/21 1136)    Followed by  0.9 %  sodium chloride infusion (1,000 mLs Intravenous New Bag/Given 01/15/21 1129)  morphine 4 MG/ML injection 4 mg (4 mg Intravenous Given 01/15/21 1015)  ondansetron (ZOFRAN) injection 4 mg (4 mg Intravenous Given 01/15/21 1014)  cefTRIAXone (ROCEPHIN) 1 g in sodium chloride 0.9 % 100 mL IVPB (0 g Intravenous Stopped 01/15/21 1225)  iohexol (OMNIPAQUE) 300 MG/ML solution 100 mL (100 mLs Intravenous Contrast Given 01/15/21 1230)    ED Course  I have reviewed the triage vital signs and the nursing notes.  Pertinent labs & imaging results that were available during my care of the patient were reviewed by me and considered in my medical decision making (see chart for details).  Clinical Course as of 01/15/21 1520  Fri Jan 15, 2021  0932 Brief non diangostic bedside US.  No pregnancy identified.  No free fluid. [JK]  1123 Urinalysis suggest the possibility of infection. [JK]  1123 Pregnancy test is negative. [JK]  1123 We will proceed with abdominal pelvic CT scans considering her abdominal distention. [JK]  1508 Gallbladder sludge noted on [place].  No cholecystitis [JK]    Clinical Course User Index [JK] Linwood Dibbles, MD   MDM Rules/Calculators/A&P                         Pt with abdominal swelling, pain.  WIll check labs, rule out pregnancy, consider ascites liver failure, abdominal distension due to obstruction, ileus.  Patient's laboratory tests were notable for negative  pregnancy test.  Patient's metabolic panel does not show any evidence to suggest acute liver disease.  CBC was unremarkable.  CT scan was performed and it did not show any evidence of obstruction or bowel abnormality.  There was question of gallbladder sludge and possible cholecystitis or right  upper quadrant ultrasound was performed this was negative for signs of acute cholecystitis.  Patient has remained stable while she is in the ED.  Her heart rate is normal.  She has not had any issues with vomiting here in the ED.  Patient does have an abnormal urinalysis.  She does appear to have a urinary tract infection.   Unclear etiology of her complaints of distention earlier.  We have no focal abnormalities noted today.  Patient does have history of substance abuse.  She certainly could be having some issues with constipation or withdrawal issues.  At this time there does not appear to be any evidence of an acute emergency medical condition and the patient appears stable for discharge with appropriate outpatient follow up.  Final Clinical Impression(s) / ED Diagnoses Final diagnoses:  Abdominal pain  Acute cystitis without hematuria    Rx / DC Orders ED Discharge Orders         Ordered    cephALEXin (KEFLEX) 500 MG capsule  3 times daily        01/15/21 1519    ondansetron (ZOFRAN ODT) 8 MG disintegrating tablet  Every 8 hours PRN        01/15/21 1519           Linwood Dibbles, MD 01/15/21 [phone]

## 2021-01-20 LAB — CULTURE, BLOOD (ROUTINE X 2)
Culture: NO GROWTH
Culture: NO GROWTH
Special Requests: ADEQUATE
Special Requests: ADEQUATE

## 2022-01-07 ENCOUNTER — Encounter: Payer: Medicaid Other | Admitting: Internal Medicine

## 2022-02-26 IMAGING — CT CT ABD-PELV W/ CM
2 of 4 series · 16 of 46 positions shown, 18 images · IV contrast (Omnipaque)
Comparison: None

CLINICAL DATA: Abdominal distension, abdominal pain, nausea,
vomiting, and diarrhea for 1 week

EXAM:
CT ABDOMEN AND PELVIS WITH CONTRAST
TECHNIQUE: Multidetector CT imaging of the abdomen and pelvis was performed
using the standard protocol following bolus administration of
intravenous contrast. Sagittal and coronal MPR images reconstructed
from axial data set.
CONTRAST:  100mL OMNIPAQUE IOHEXOL 300 MG/ML SOLN IV. No oral
contrast.

[Series 2: axial st · axial · 0.85mm/px · z∈[-509,-69]mm · 13 of 97 slices shown, 15 images]
[im 5/97  soft-tissue]
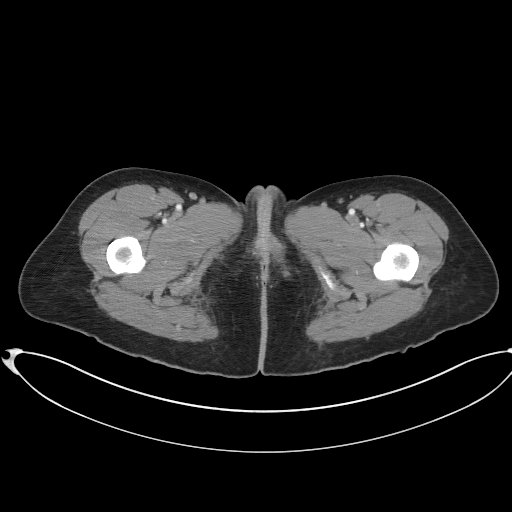
[im 5/97  bone]
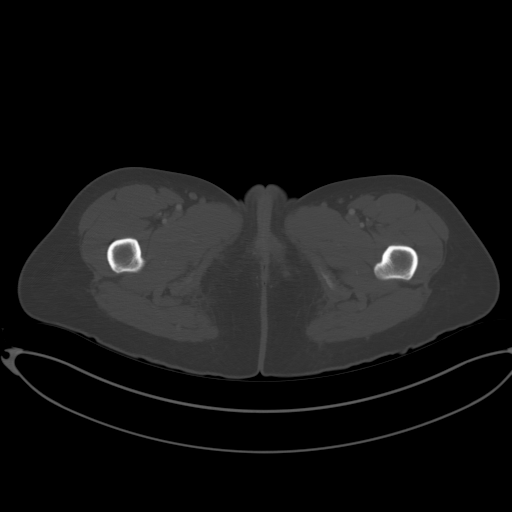
[im 13/97  soft-tissue]
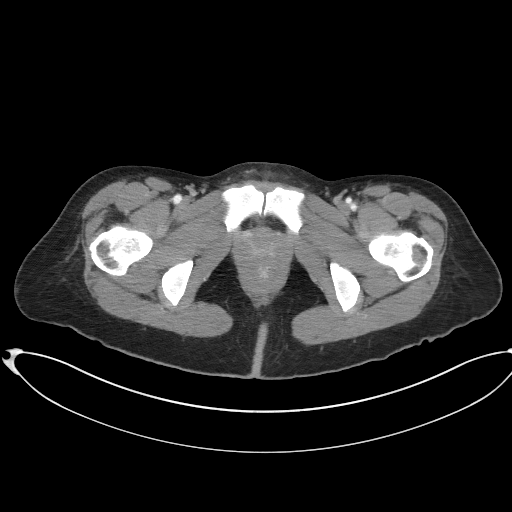
[im 21/97  soft-tissue]
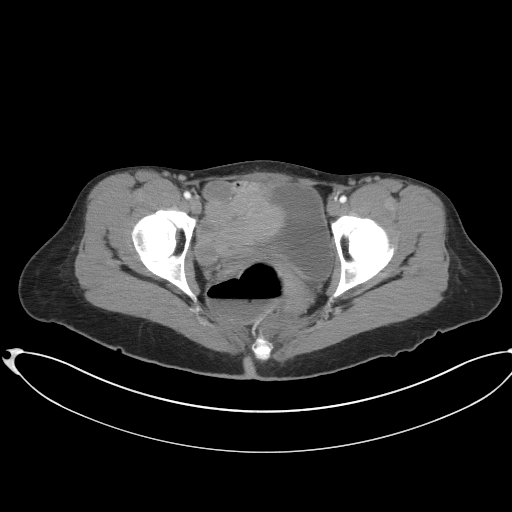
[im 29/97  soft-tissue]
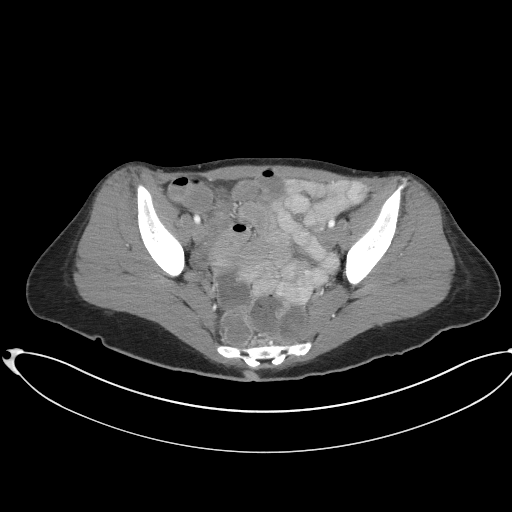
[im 33/97  soft-tissue]
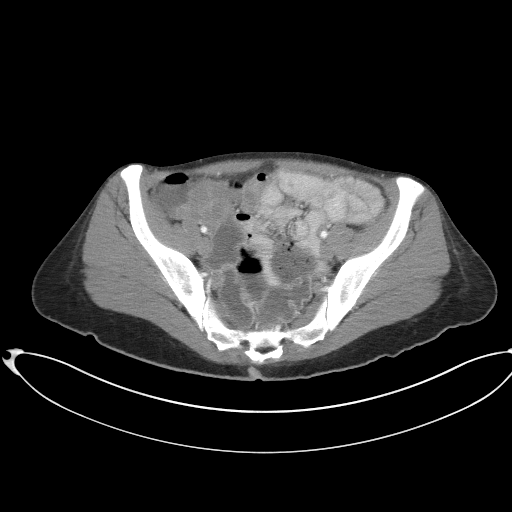
[im 41/97  soft-tissue]
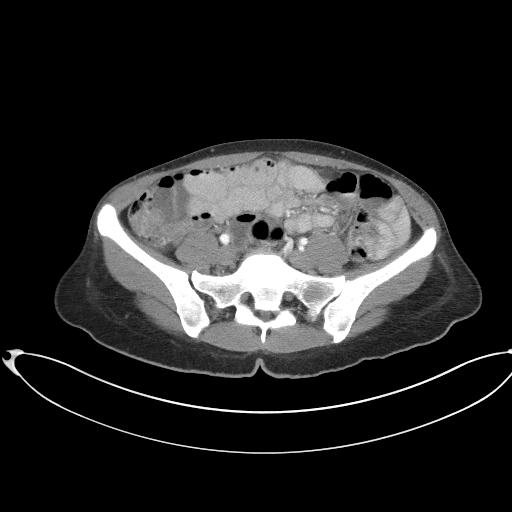
[im 49/97  soft-tissue]
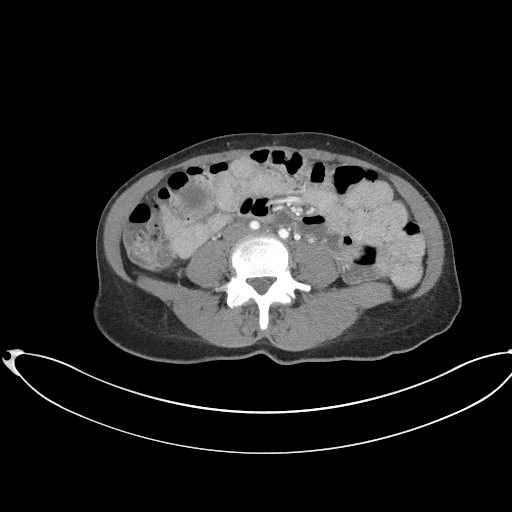
[im 57/97  soft-tissue]
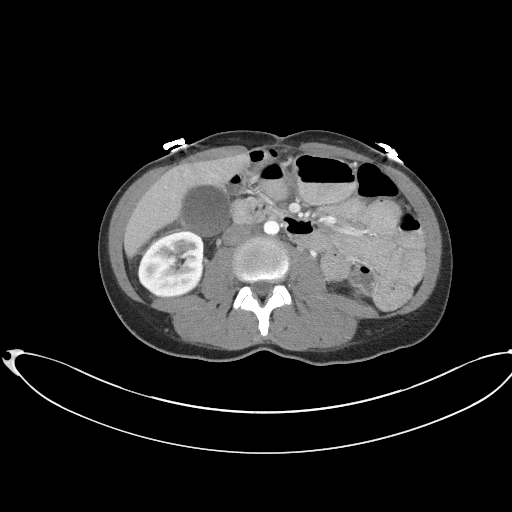
[im 65/97  soft-tissue]
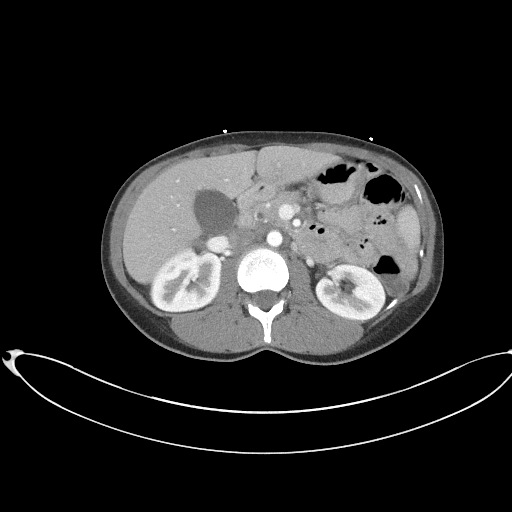
[im 65/97  bone]
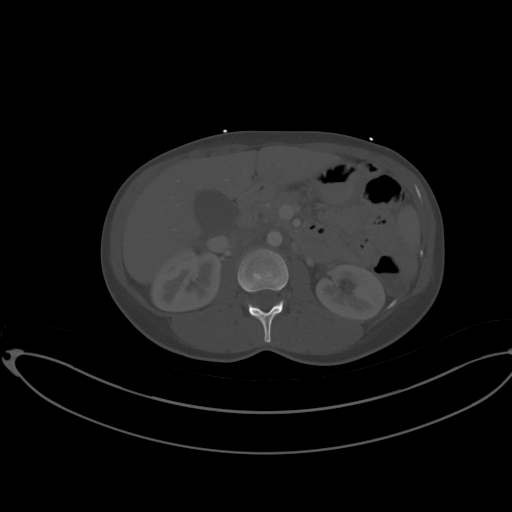
[im 69/97  soft-tissue]
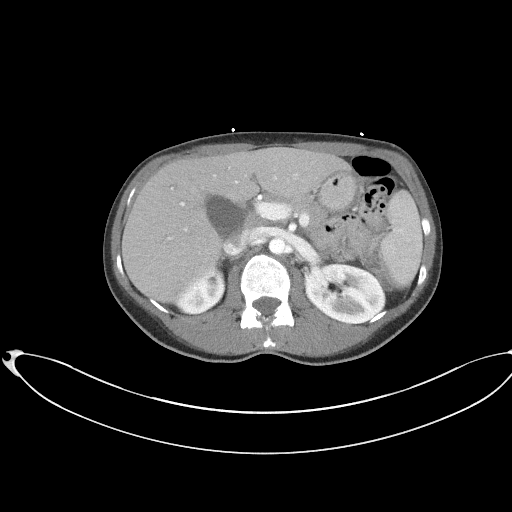
[im 77/97  soft-tissue]
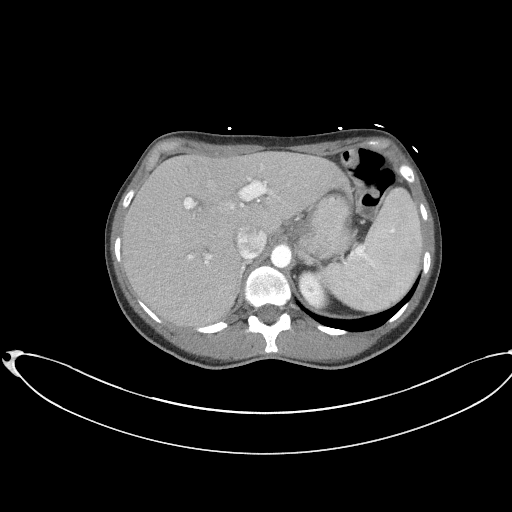
[im 85/97  soft-tissue]
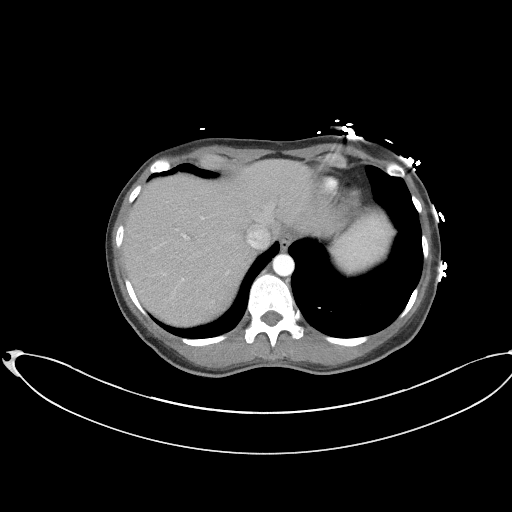
[im 93/97  soft-tissue]
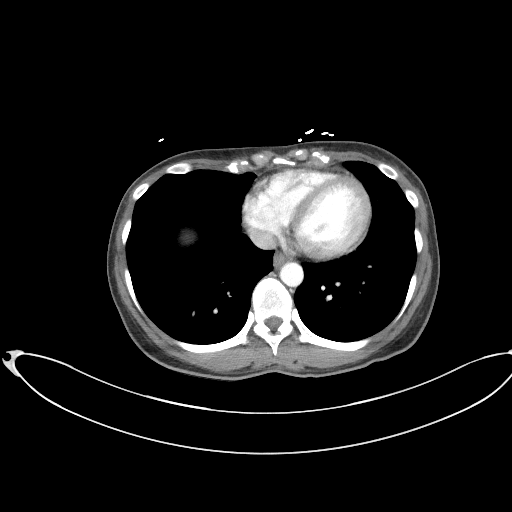

[Series 5: coronal st · coronal · 0.80mm/px · 3 of 72 slices shown]
[im 24/72  soft-tissue]
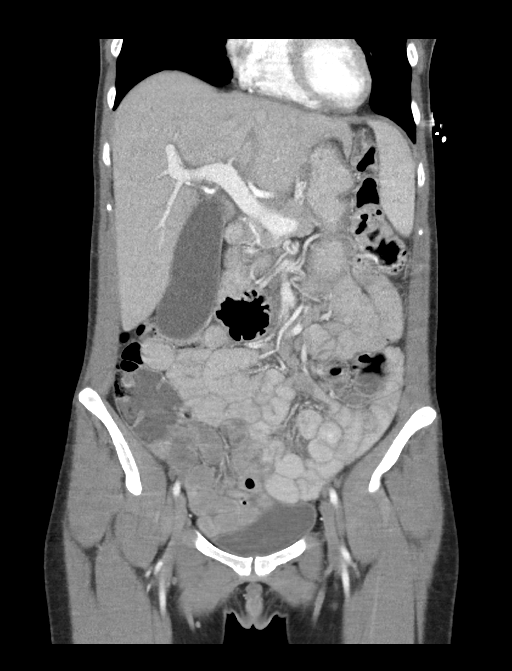
[im 32/72  soft-tissue]
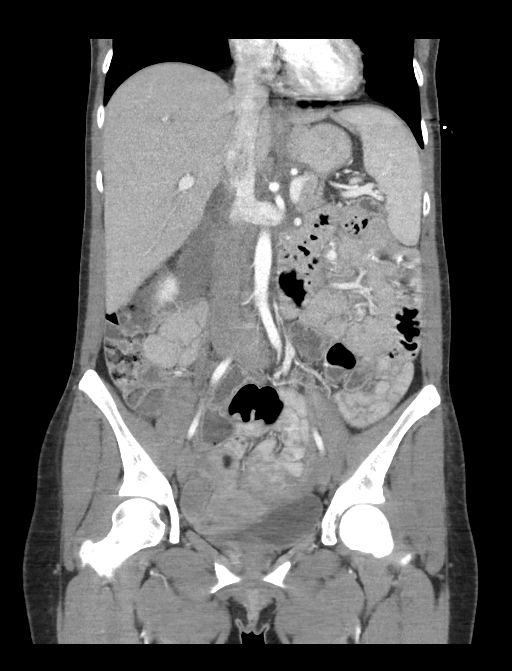
[im 40/72  soft-tissue]
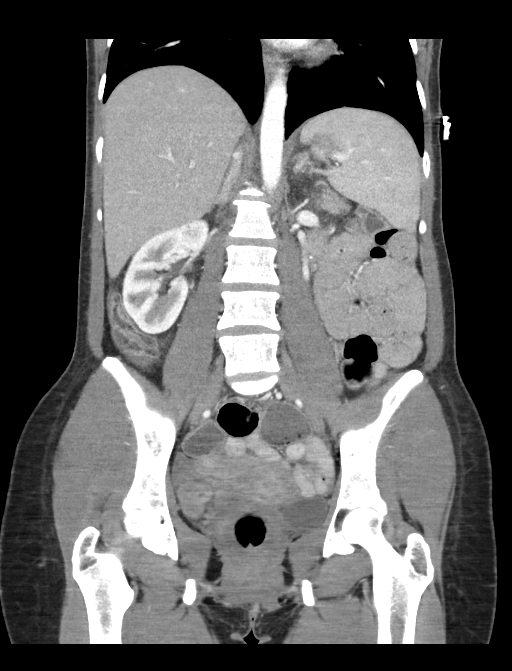

[16 of 46 positions shown; findings below may reference images not displayed]

FINDINGS: Lower chest: Minimal dependent atelectasis at RIGHT lung base. LEFT
lung base clear.

Hepatobiliary: Gallbladder appears mildly distended without gross
wall thickening or calcification by CT. No biliary dilatation. Liver
normal appearance.

Pancreas: Normal appearance

Spleen: Normal appearance

Adrenals/Urinary Tract: Adrenal glands normal appearance. Small
BILATERAL renal cysts. Kidneys, ureters, and bladder normal
appearance

Stomach/Bowel: Appendix not definitely visualized but no pericecal
inflammatory process is seen. Stomach decompressed. Large and small
bowel loops unremarkable for exam lacking GI contrast.

Vascular/Lymphatic: Scattered pelvic phleboliths. Aorta normal
caliber. Vascular structures patent.

Reproductive: Unremarkable uterus and adnexa

Other: No free air or free fluid. No hernia or infra phlegm [REDACTED]
process.

Musculoskeletal: Unremarkable
IMPRESSION: Mildly distended gallbladder without gross wall thickening or
calcification by CT; if there is clinical concern for cholelithiasis
or acute cholecystitis, recommend ultrasound assessment.

Small BILATERAL renal cysts.

No other intra-abdominal or intrapelvic abnormalities identified.

## 2022-11-06 ENCOUNTER — Other Ambulatory Visit: Payer: Self-pay

## 2022-11-06 ENCOUNTER — Emergency Department (HOSPITAL_COMMUNITY)
Admission: EM | Admit: 2022-11-06 | Discharge: 2022-11-07 | Disposition: A | Discharge status: Other Acute Inpatient Hospital | Payer: Medicaid Other | Attending: Emergency Medicine | Admitting: Emergency Medicine

## 2022-11-06 ENCOUNTER — Encounter (HOSPITAL_COMMUNITY): Payer: Self-pay

## 2022-11-06 DIAGNOSIS — Z91040 Latex allergy status: Secondary | ICD-10-CM | POA: Diagnosis not present

## 2022-11-06 DIAGNOSIS — N9489 Other specified conditions associated with female genital organs and menstrual cycle: Secondary | ICD-10-CM | POA: Diagnosis not present

## 2022-11-06 DIAGNOSIS — Z1152 Encounter for screening for COVID-19: Secondary | ICD-10-CM | POA: Diagnosis not present

## 2022-11-06 DIAGNOSIS — F1110 Opioid abuse, uncomplicated: Secondary | ICD-10-CM | POA: Insufficient documentation

## 2022-11-06 DIAGNOSIS — F1410 Cocaine abuse, uncomplicated: Secondary | ICD-10-CM | POA: Diagnosis not present

## 2022-11-06 DIAGNOSIS — F1910 Other psychoactive substance abuse, uncomplicated: Secondary | ICD-10-CM

## 2022-11-06 LAB — COMPREHENSIVE METABOLIC PANEL
ALT: 176 U/L — ABNORMAL HIGH (ref 0–44)
AST: 94 U/L — ABNORMAL HIGH (ref 15–41)
Albumin: 3.6 g/dL (ref 3.5–5.0)
Alkaline Phosphatase: 77 U/L (ref 38–126)
Anion gap: 9 (ref 5–15)
BUN: 16 mg/dL (ref 6–20)
CO2: 29 mmol/L (ref 22–32)
Calcium: 9.1 mg/dL (ref 8.9–10.3)
Chloride: 100 mmol/L (ref 98–111)
Creatinine, Ser: 1.12 mg/dL — ABNORMAL HIGH (ref 0.44–1.00)
GFR, Estimated: >60 mL/min (ref >60)
Glucose, Bld: 119 mg/dL — ABNORMAL HIGH (ref 70–99)
Potassium: 4 mmol/L (ref 3.5–5.1)
Sodium: 138 mmol/L (ref 135–145)
Total Bilirubin: 0.3 mg/dL (ref 0.3–1.2)
Total Protein: 7.2 g/dL (ref 6.5–8.1)

## 2022-11-06 LAB — CBC WITH DIFFERENTIAL/PLATELET
Abs Immature Granulocytes: 0.01 10*3/uL (ref 0.00–0.07)
Basophils Absolute: 0 10*3/uL (ref 0.0–0.1)
Basophils Relative: 1 %
Eosinophils Absolute: 0.5 10*3/uL (ref 0.0–0.5)
Eosinophils Relative: 8 %
HCT: 47.1 % — ABNORMAL HIGH (ref 36.0–46.0)
Hemoglobin: 15.2 g/dL — ABNORMAL HIGH (ref 12.0–15.0)
Immature Granulocytes: 0 %
Lymphocytes Relative: 37 %
Lymphs Abs: 2.3 10*3/uL (ref 0.7–4.0)
MCH: 29 pg (ref 26.0–34.0)
MCHC: 32.3 g/dL (ref 30.0–36.0)
MCV: 89.7 fL (ref 80.0–100.0)
Monocytes Absolute: 0.6 10*3/uL (ref 0.1–1.0)
Monocytes Relative: 9 %
Neutro Abs: 2.9 10*3/uL (ref 1.7–7.7)
Neutrophils Relative %: 45 %
Platelets: 255 10*3/uL (ref 150–400)
RBC: 5.25 MIL/uL — ABNORMAL HIGH (ref 3.87–5.11)
RDW: 13.4 % (ref 11.5–15.5)
WBC: 6.3 10*3/uL (ref 4.0–10.5)
nRBC: 0 % (ref 0.0–0.2)

## 2022-11-06 LAB — I-STAT BETA HCG BLOOD, ED (MC, WL, AP ONLY): I-stat hCG, quantitative: <5 m[IU]/mL (ref <5)

## 2022-11-06 LAB — ETHANOL: Alcohol, Ethyl (B): <10 mg/dL (ref <10)

## 2022-11-06 NOTE — ED Triage Notes (Signed)
Pt arrived to triage looking for detox and rehab treatment. Pt states she last used just before checking in to ED.   Pt uses opioids, has not injected for several months  Pt also concerned about her depression and PTSD  Pt states that she is having SI, has had suicide attempts in the past
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
## 2022-11-07 ENCOUNTER — Other Ambulatory Visit (HOSPITAL_COMMUNITY)
Admission: EM | Admit: 2022-11-07 | Discharge: 2022-11-08 | Disposition: A | Discharge status: Home / Self Care | Payer: Medicaid Other | Attending: Psychiatry | Admitting: Psychiatry

## 2022-11-07 DIAGNOSIS — F4310 Post-traumatic stress disorder, unspecified: Secondary | ICD-10-CM | POA: Insufficient documentation

## 2022-11-07 DIAGNOSIS — Z9151 Personal history of suicidal behavior: Secondary | ICD-10-CM | POA: Insufficient documentation

## 2022-11-07 DIAGNOSIS — Z5900 Homelessness unspecified: Secondary | ICD-10-CM | POA: Insufficient documentation

## 2022-11-07 DIAGNOSIS — F1994 Other psychoactive substance use, unspecified with psychoactive substance-induced mood disorder: Secondary | ICD-10-CM

## 2022-11-07 DIAGNOSIS — F1114 Opioid abuse with opioid-induced mood disorder: Secondary | ICD-10-CM | POA: Diagnosis not present

## 2022-11-07 DIAGNOSIS — F1414 Cocaine abuse with cocaine-induced mood disorder: Secondary | ICD-10-CM | POA: Insufficient documentation

## 2022-11-07 DIAGNOSIS — F1910 Other psychoactive substance abuse, uncomplicated: Secondary | ICD-10-CM | POA: Diagnosis not present

## 2022-11-07 DIAGNOSIS — F17210 Nicotine dependence, cigarettes, uncomplicated: Secondary | ICD-10-CM | POA: Insufficient documentation

## 2022-11-07 DIAGNOSIS — F1290 Cannabis use, unspecified, uncomplicated: Secondary | ICD-10-CM | POA: Insufficient documentation

## 2022-11-07 DIAGNOSIS — B1920 Unspecified viral hepatitis C without hepatic coma: Secondary | ICD-10-CM | POA: Insufficient documentation

## 2022-11-07 LAB — RAPID URINE DRUG SCREEN, HOSP PERFORMED
Amphetamines: NOT DETECTED
Barbiturates: NOT DETECTED
Benzodiazepines: NOT DETECTED
Cocaine: POSITIVE — AB
Opiates: POSITIVE — AB
Tetrahydrocannabinol: POSITIVE — AB

## 2022-11-07 LAB — RESP PANEL BY RT-PCR (RSV, FLU A&B, COVID)  RVPGX2
Influenza A by PCR: NEGATIVE
Influenza B by PCR: NEGATIVE
Resp Syncytial Virus by PCR: NEGATIVE
SARS Coronavirus 2 by RT PCR: NEGATIVE

## 2022-11-07 LAB — TSH: TSH: 0.481 u[IU]/mL (ref 0.350–4.500)

## 2022-11-07 LAB — HEPATITIS PANEL, ACUTE
HCV Ab: REACTIVE — AB
Hep A IgM: NONREACTIVE
Hep B C IgM: NONREACTIVE
Hepatitis B Surface Ag: NONREACTIVE

## 2022-11-07 LAB — T4, FREE: Free T4: 0.76 ng/dL (ref 0.61–1.12)

## 2022-11-07 LAB — HIV ANTIBODY (ROUTINE TESTING W REFLEX): HIV Screen 4th Generation wRfx: NONREACTIVE

## 2022-11-07 MED ORDER — METHOCARBAMOL 500 MG PO TABS
500.0000 mg | ORAL_TABLET | Freq: Three times a day (TID) | ORAL | Status: DC | PRN
  Administered 2022-11-07 – 2022-11-08 (×3): 500 mg via ORAL
  Filled 2022-11-07 (×3): qty 1

## 2022-11-07 MED ORDER — LOPERAMIDE HCL 2 MG PO CAPS: 2.0000 mg | ORAL_CAPSULE | ORAL | Status: DC | PRN

## 2022-11-07 MED ORDER — ACETAMINOPHEN 325 MG PO TABS: 650.0000 mg | ORAL_TABLET | Freq: Four times a day (QID) | ORAL | Status: DC | PRN

## 2022-11-07 MED ORDER — DICYCLOMINE HCL 20 MG PO TABS
20.0000 mg | ORAL_TABLET | Freq: Four times a day (QID) | ORAL | Status: DC | PRN
  Administered 2022-11-07 – 2022-11-08 (×3): 20 mg via ORAL
  Filled 2022-11-07 (×3): qty 1

## 2022-11-07 MED ORDER — TRAZODONE HCL 50 MG PO TABS: 50.0000 mg | ORAL_TABLET | Freq: Every evening | ORAL | Status: DC | PRN

## 2022-11-07 MED ORDER — ALUM & MAG HYDROXIDE-SIMETH 200-200-20 MG/5ML PO SUSP: 30.0000 mL | ORAL | Status: DC | PRN

## 2022-11-07 MED ORDER — NAPROXEN 500 MG PO TABS
500.0000 mg | ORAL_TABLET | Freq: Two times a day (BID) | ORAL | Status: DC | PRN
  Administered 2022-11-07 – 2022-11-08 (×2): 500 mg via ORAL
  Filled 2022-11-07 (×2): qty 1

## 2022-11-07 MED ORDER — MAGNESIUM HYDROXIDE 400 MG/5ML PO SUSP: 30.0000 mL | Freq: Every day | ORAL | Status: DC | PRN

## 2022-11-07 MED ORDER — HYDROXYZINE HCL 25 MG PO TABS
25.0000 mg | ORAL_TABLET | Freq: Four times a day (QID) | ORAL | Status: DC | PRN
  Administered 2022-11-07 – 2022-11-08 (×3): 25 mg via ORAL
  Filled 2022-11-07 (×3): qty 1

## 2022-11-07 MED ORDER — ONDANSETRON 4 MG PO TBDP
4.0000 mg | ORAL_TABLET | Freq: Four times a day (QID) | ORAL | Status: DC | PRN
  Administered 2022-11-07 – 2022-11-08 (×3): 4 mg via ORAL
  Filled 2022-11-07 (×3): qty 1

## 2022-11-07 NOTE — ED Provider Notes (Signed)
Facility Based Crisis Admission H&P  Date: 11/07/22 Patient Name: Linda [name] MRN: [id number] Chief Complaint:  Chief Complaint  Patient presents with   Addiction Problem      Diagnoses:  Final diagnoses:  Substance abuse ([place])  Substance induced mood disorder (HCC)    HPI: Linda [name] is a 36 year old female with history of depression, anxiety, PTSD, suicidal attempt, and polysubstance abuse (marijuana, fentanyl, cocaine).  Patient presented voluntarily to [facility] requesting substance abuse treatment.  Patient was evaluated and recommended for admission to [facility] for substance abuse treatment and management of withdrawal symptoms.  This nurse practitioner met with patient face-to-face and reviewed her chart and confirmed assessment information with patient upon her arrival to [facility].  On evaluation, patient is slightly drowsy, oriented x 4; calm and cooperative.  Her speech is clear and coherent.  She has good eye contact.  Patient's mood is depressed with congruent affect.  Her thought process is coherent.  No signs that patient is responding to any internal/external stimuli or experiencing any delusional thought content during this interaction.  She reports worsening depressive symptoms due to ongoing struggle with substance abuse.  Patient reports that she is using approximately 2 to 4 g of fentanyl daily. She says she is concerned about the amount of fentanyl that she is using and worried about accidentally overdosing on fentanyl/heroine. She says she is using fentanyl by inhalation (smoking). She says she stopped injecting fentanyl approximately 6 weeks ago after developing an abscess on her right AC vein. No signs of infection noted at right [facility] or other sites at this time and her vitals are WNL. She endorses depressive symptoms of crying spells, isolation, fatigue, irritability, poor concentration, erratic sleep, decreased appetite, feelings of guilt, worthlessness and  hopelessness. She denies suicidal and homicidal ideation, paranoia, and hallucination.    PHQ 2-9:  [place] ED from 11/07/2022 in [facility]  Thoughts that you would be better off dead, or of hurting yourself in some way More than half the days  PHQ-9 Total Score 18       [place] [facility] from 11/07/2022 in [facility] ED from 11/06/2022 in [place] ED from 01/15/2021 in [place] No Risk Low Risk Error: Question 6 not populated        Total Time spent with patient: 20 minutes  Musculoskeletal  Strength & Muscle Tone: within normal limits Gait & Station: normal Patient leans: Right  Psychiatric Specialty Exam  Presentation General Appearance:  Disheveled  Eye Contact: Good  Speech: Clear and Coherent  Speech Volume: Normal  Handedness: Right   Mood and Affect  Mood: Depressed  Affect: Congruent   Thought Process  Thought Processes: Coherent  Descriptions of Associations:Intact  Orientation:Full (Time, Place and Person)  Thought Content:WDL  Diagnosis of Schizophrenia or Schizoaffective disorder in past: No   Hallucinations:Hallucinations: None  Ideas of Reference:None  Suicidal Thoughts:Suicidal Thoughts: No  Homicidal Thoughts:Homicidal Thoughts: No   Sensorium  Memory: Immediate Good; Remote Good; Recent Good  Judgment: Fair  Insight: Good   Executive Functions  Concentration: Good  Attention Span: Good  Recall: Good  Fund of Knowledge: Good  Language: Good   Psychomotor Activity  Psychomotor Activity: Psychomotor Activity: Normal   Assets  Assets: Communication Skills; Desire for Improvement   Sleep  Sleep: Sleep: Fair Number of Hours of Sleep: 5   Nutritional Assessment (For OBS and FBC  admissions only) Has the patient had a weight loss or gain  of 10 pounds or more in the last 3 months?: No Has the patient had a decrease in food intake/or appetite?: No Does the patient have dental problems?: No Does the patient have eating habits or behaviors that may be indicators of an eating disorder including binging or inducing vomiting?: No Has the patient recently lost weight without trying?: 0 Has the patient been eating poorly because of a decreased appetite?: 0 Malnutrition Screening Tool Score: 0    Physical Exam Vitals and nursing note reviewed.  Constitutional:      General: She is not in acute distress.    Appearance: She is well-developed. She is not ill-appearing.  HENT:     Head: Normocephalic and atraumatic.  Eyes:     Conjunctiva/sclera: Conjunctivae normal.  Cardiovascular:     Rate and Rhythm: Normal rate.  Pulmonary:     Effort: No respiratory distress.  Abdominal:     Palpations: Abdomen is soft.     Tenderness: There is no abdominal tenderness.  Musculoskeletal:        General: No swelling.     Cervical back: Neck supple.  Skin:    General: Skin is warm and dry.     Capillary Refill: Capillary refill takes less than 2 seconds.  Neurological:     Mental Status: She is alert and oriented to person, place, and time.  Psychiatric:        Attention and Perception: Attention and perception normal.        Mood and Affect: Mood is anxious and depressed.        Speech: Speech normal.        Behavior: Behavior normal. Behavior is cooperative.        Thought Content: Thought content normal.        Cognition and Memory: Cognition normal.    Review of Systems  Constitutional: Negative.   HENT: Negative.    Eyes: Negative.   Respiratory: Negative.    Cardiovascular: Negative.   Gastrointestinal: Negative.   Genitourinary: Negative.   Musculoskeletal: Negative.   Skin: Negative.   Neurological: Negative.   Endo/Heme/Allergies: Negative.   Psychiatric/Behavioral:  Positive for depression and substance abuse.  The patient is nervous/anxious.     Blood pressure 97/64, pulse 68, temperature 98 F (36.7 C), temperature source Oral, resp. rate 17, SpO2 94 %. There is no height or weight on file to calculate BMI.  Past Psychiatric History:  depression, anxiety, PTSD, suicidal attempt, and polysubstance abuse (marijuana, fentanyl, cocaine).    Is the patient at risk to self? No  Has the patient been a risk to self in the past 6 months? No .    Has the patient been a risk to self within the distant past? Yes   Is the patient a risk to others? No   Has the patient been a risk to others in the past 6 months? No   Has the patient been a risk to others within the distant past? No   Past Medical History:  Past Medical History:  Diagnosis Date   Cardiomyopathy ([place])    Fibromyalgia    Hashimoto's disease    Polysubstance abuse ([place])    Pulmonary embolism ([place])    Thyroid disease     Past Surgical History:  Procedure Laterality Date   TONSILLECTOMY      Family History:  Family History  Family history unknown: Yes    Social History:  Social History   Socioeconomic History   Marital status: Divorced    Spouse name: Not on file   Number of children: Not on file   Years of education: Not on file   Highest education level: Not on file  Occupational History   Not on file  Tobacco Use   Smoking status: Every Day    Packs/day: 0.50    Types: Cigarettes   Smokeless tobacco: Never  Vaping Use   Vaping Use: Never used  Substance and Sexual Activity   Alcohol use: Never   Drug use: Yes    Types: Methamphetamines, Marijuana    Comment: IV drug use   Sexual activity: Not on file  Other Topics Concern   Not on file  Social History Narrative   Not on file   Social Determinants of Health   Financial Resource Strain: Not on file  Food Insecurity: Not on file  Transportation Needs: Not on file  Physical Activity: Not on file  Stress: Not on file  Social Connections: Not on file   Intimate Partner Violence: Not on file    SDOH:  SDOH Screenings   Depression (PHQ2-9): High Risk (11/07/2022)  Tobacco Use: High Risk (11/06/2022)    Last Labs:  Admission on 11/07/2022  Component Date Value Ref Range Status   Hepatitis B Surface Ag 11/07/2022 NON REACTIVE  NON REACTIVE Final   HCV Ab 11/07/2022 Reactive (A)  NON REACTIVE Final   Comment: (NOTE) The CDC recommends that a Reactive HCV antibody result be followed up  with a HCV Nucleic Acid Amplification test.     Hep A IgM 11/07/2022 NON REACTIVE  NON REACTIVE Final   Hep B C IgM 11/07/2022 NON REACTIVE  NON REACTIVE Final   Performed at [facility], [place] [address] [place], [place] [address]   HIV Screen 4th Generation wRfx 11/07/2022 Non Reactive  Non Reactive Final   Performed at [facility], [place] [address], [place], [place] [address]   Free T4 11/07/2022 0.76  0.61 - 1.12 ng/dL Final   Comment: (NOTE) Biotin ingestion may interfere with free T4 tests. If the results are inconsistent with the TSH level, previous test results, or the clinical presentation, then consider biotin interference. If needed, order repeat testing after stopping biotin. Performed at [facility], [place] [address], [place], [place] [address]    TSH 11/07/2022 0.481  0.350 - 4.500 uIU/mL Final   Comment: Performed by a 3rd Generation assay with a functional sensitivity of <=0.01 uIU/mL. Performed at [facility], [place] [address], [place], [place] [address]   Admission on 11/06/2022, Discharged on 11/07/2022  Component Date Value Ref Range Status   WBC 11/06/2022 6.3  4.0 - 10.5 K/uL Final   RBC 11/06/2022 5.25 (H)  3.87 - 5.11 MIL/uL Final   Hemoglobin 11/06/2022 15.2 (H)  12.0 - 15.0 g/dL Final   HCT 11/06/2022 47.1 (H)  36.0 - 46.0 % Final   MCV 11/06/2022 89.7  80.0 - 100.0 fL Final   MCH 11/06/2022 29.0  26.0 - 34.0 pg Final   MCHC 11/06/2022 32.3  30.0 - 36.0 g/dL Final   RDW 11/06/2022 13.4  11.5 -  15.5 % Final   Platelets 11/06/2022 255  150 - 400 K/uL Final   nRBC 11/06/2022 0.0  0.0 - 0.2 % Final   Neutrophils Relative % 11/06/2022 45  % Final   Neutro Abs 11/06/2022 2.9  1.7 - 7.7 K/uL Final  Lymphocytes Relative 11/06/2022 37  % Final   Lymphs Abs 11/06/2022 2.3  0.7 - 4.0 K/uL Final   Monocytes Relative 11/06/2022 9  % Final   Monocytes Absolute 11/06/2022 0.6  0.1 - 1.0 K/uL Final   Eosinophils Relative 11/06/2022 8  % Final   Eosinophils Absolute 11/06/2022 0.5  0.0 - 0.5 K/uL Final   Basophils Relative 11/06/2022 1  % Final   Basophils Absolute 11/06/2022 0.0  0.0 - 0.1 K/uL Final   Immature Granulocytes 11/06/2022 0  % Final   Abs Immature Granulocytes 11/06/2022 0.01  0.00 - 0.07 K/uL Final   Performed at [place] [address] [place], [place] [address]   Sodium 11/06/2022 138  135 - 145 mmol/L Final   Potassium 11/06/2022 4.0  3.5 - 5.1 mmol/L Final   Chloride 11/06/2022 100  98 - 111 mmol/L Final   CO2 11/06/2022 29  22 - 32 mmol/L Final   Glucose, Bld 11/06/2022 119 (H)  70 - 99 mg/dL Final   Glucose reference range applies only to samples taken after fasting for at least 8 hours.   BUN 11/06/2022 16  6 - 20 mg/dL Final   Creatinine, Ser 11/06/2022 1.12 (H)  0.44 - 1.00 mg/dL Final   Calcium 11/06/2022 9.1  8.9 - 10.3 mg/dL Final   Total Protein 11/06/2022 7.2  6.5 - 8.1 g/dL Final   Albumin 11/06/2022 3.6  3.5 - 5.0 g/dL Final   AST 11/06/2022 94 (H)  15 - 41 U/L Final   ALT 11/06/2022 176 (H)  0 - 44 U/L Final   Alkaline Phosphatase 11/06/2022 77  38 - 126 U/L Final   Total Bilirubin 11/06/2022 0.3  0.3 - 1.2 mg/dL Final   GFR, Estimated 11/06/2022 >60  >60 mL/min Final   Comment: (NOTE) Calculated using the CKD-EPI Creatinine Equation (2021)    Anion gap 11/06/2022 9  5 - 15 Final   Performed at [facility] [address], [place], [place] [address]   Alcohol, Ethyl (B) 11/06/2022 <10  <10 mg/dL Final   Comment: (NOTE) Lowest  detectable limit for serum alcohol is 10 mg/dL.  For medical purposes only. Performed at [facility], [place] [address] [place], [place] [address]    Opiates 11/07/2022 POSITIVE (A)  NONE DETECTED Final   Cocaine 11/07/2022 POSITIVE (A)  NONE DETECTED Final   Benzodiazepines 11/07/2022 NONE DETECTED  NONE DETECTED Final   Amphetamines 11/07/2022 NONE DETECTED  NONE DETECTED Final   Tetrahydrocannabinol 11/07/2022 POSITIVE (A)  NONE DETECTED Final   Barbiturates 11/07/2022 NONE DETECTED  NONE DETECTED Final   Comment: (NOTE) DRUG SCREEN FOR MEDICAL PURPOSES ONLY.  IF CONFIRMATION IS NEEDED FOR ANY PURPOSE, NOTIFY LAB WITHIN 5 DAYS.  LOWEST DETECTABLE LIMITS FOR URINE DRUG SCREEN Drug Class                     Cutoff (ng/mL) Amphetamine and metabolites    1000 Barbiturate and metabolites    200 Benzodiazepine                 200 Opiates and metabolites        300 Cocaine and metabolites        300 THC                            50 Performed at [facility], [place],  [place] [address]    I-stat hCG, quantitative 11/06/2022 <5.0  <5 mIU/mL Final   Comment 3 11/06/2022          Final   Comment:   GEST. AGE      CONC.  (mIU/mL)   <=1 WEEK        5 - 50     2 WEEKS       50 - 500     3 WEEKS       100 - 10,000     4 WEEKS     1,000 - 30,000        FEMALE AND NON-PREGNANT FEMALE:     LESS THAN 5 mIU/mL    SARS Coronavirus 2 by RT PCR 11/06/2022 NEGATIVE  NEGATIVE Final   Comment: (NOTE) SARS-CoV-2 target nucleic acids are NOT DETECTED.  The SARS-CoV-2 RNA is generally detectable in upper respiratory specimens during the acute phase of infection. The lowest concentration of SARS-CoV-2 viral copies this assay can detect is 138 copies/mL. A negative result does not preclude SARS-Cov-2 infection and should not be used as the sole basis for treatment or other patient management decisions. A negative result may occur with  improper specimen  collection/handling, submission of specimen other than nasopharyngeal swab, presence of viral mutation(s) within the areas targeted by this assay, and inadequate number of viral copies(<138 copies/mL). A negative result must be combined with clinical observations, patient history, and epidemiological information. The expected result is Negative.  Fact Sheet for Patients:  EntrepreneurPulse.com.au  Fact Sheet for Healthcare Providers:  IncredibleEmployment.be  This test is no                          t yet approved or cleared by the [place] FDA and  has been authorized for detection and/or diagnosis of SARS-CoV-2 by FDA under an Emergency Use Authorization (EUA). This EUA will remain  in effect (meaning this test can be used) for the duration of the COVID-19 declaration under Section 564(b)(1) of the Act, 21 U.S.C.section 360bbb-3(b)(1), unless the authorization is terminated  or revoked sooner.       Influenza A by PCR 11/06/2022 NEGATIVE  NEGATIVE Final   Influenza B by PCR 11/06/2022 NEGATIVE  NEGATIVE Final   Comment: (NOTE) The Xpert Xpress SARS-CoV-2/FLU/RSV plus assay is intended as an aid in the diagnosis of influenza from Nasopharyngeal swab specimens and should not be used as a sole basis for treatment. Nasal washings and aspirates are unacceptable for Xpert Xpress SARS-CoV-2/FLU/RSV testing.  Fact Sheet for Patients: EntrepreneurPulse.com.au  Fact Sheet for Healthcare Providers: IncredibleEmployment.be  This test is not yet approved or cleared by the [place] FDA and has been authorized for detection and/or diagnosis of SARS-CoV-2 by FDA under an Emergency Use Authorization (EUA). This EUA will remain in effect (meaning this test can be used) for the duration of the COVID-19 declaration under Section 564(b)(1) of the Act, 21 U.S.C. section 360bbb-3(b)(1), unless the authorization is  terminated or revoked.     Resp Syncytial Virus by PCR 11/06/2022 NEGATIVE  NEGATIVE Final   Comment: (NOTE) Fact Sheet for Patients: EntrepreneurPulse.com.au  Fact Sheet for Healthcare Providers: IncredibleEmployment.be  This test is not yet approved or cleared by the [place] FDA and has been authorized for detection and/or diagnosis of SARS-CoV-2 by FDA under an Emergency Use Authorization (EUA). This EUA will remain in effect (meaning this test can be used) for the duration of the  COVID-19 declaration under Section 564(b)(1) of the Act, 21 U.S.C. section 360bbb-3(b)(1), unless the authorization is terminated or revoked.  Performed at [facility], [place] [address], [place], [place] [address]     Allergies: Latex  PTA Medications: (Not in a hospital admission)   Long Term Goals: Improvement in symptoms so as ready for discharge  Short Term Goals: Patient will verbalize feelings in meetings with treatment team members., Patient will attend at least of 50% of the groups daily., Pt will complete the PHQ9 on admission, day 3 and discharge., Patient will participate in completing the [place], Patient will score a low risk of violence for 24 hours prior to discharge, and Patient will take medications as prescribed daily.  Medical Decision Making  Patient will be admitted to [facility] for  substance abuse treatment and management of opiate withdrawal symptoms.  -review available labs   Initiate clonidine withdrawal protocol -clonidine taper -hydroxyzine 25 mg PO every 6 hours prn for anxiety -ondansetron 4 mg ODT every 6 hours prn nausea/vomiting -naproxen 500 mg BID prn for pain -dicyclomine 20 mg PO every 6 hours prn abdominal cramping -loperamide 2-4 mg capsule prn diarrhea -methocarbamol 500 mg PO every 8 hours prn spasms   Recommendations  Based on my evaluation the patient does not appear to have an  emergency medical condition.  Ophelia Shoulder, NP 11/07/22  10:48 PM

## 2022-11-07 NOTE — Progress Notes (Addendum)
Received Linda [name] this AM in her bed sleeping, she got up to eat and returned to bed.  She refused the AM group therapy session this morning. She got up to eat lunch, which was very little and medicated per her complaints. She got up to eat dinner and was feeling better therefore consumed adequate nutrition with fluids. Later she got several outfits from her locker. She returned to her room after dinner.

## 2022-11-07 NOTE — ED Provider Notes (Addendum)
Behavioral Health Progress Note  Date and Time: 11/07/2022 10:49 AM Name: Linda [name] MRN:  [id number]  Subjective: Linda [name] is a  36 y.o. female with hx of polysubstance abuse, PTSD, and Hashimoto's presenting voluntarily to [facility] seeking rehab treatment for opioid use. She reports she was self motivated to seek treatment in fears of dying from her addictions. She reports use of opioids, cocaine, and marijuana with last use yesterday. She uses through cocaine through smoke. She previously used via IV route but quit one month ago. She has been diagnosed 2 years ago with Hep C and never received treatment. She reports her use of marijuana began at 36 years old, cocaine at 89, and opioids at 43. She completely a 96 day rehab in the past at [facility] and was able to maintain sobriety independently for 1 week.   She currently reports headache, muscle aches, and nausea which is consistent with her previous symptoms with detox. She was not able to sleep last night as she was transferred over late to the unit. She feels she will be able to catch up on sleep today but reports history of poor sleep with detox. She was able to eat breakfast this morning. She denies vomiting or diarrhea currently. Pt with productive cough for the past several months that is productive for a grey sputum with black specs. She denies any hemoptysis. Pt endorses passive SI in which she feels she would not be upset if she woke up dead. Pt is currently homeless in the [place] area. She denies HI and AVH. Pt without paranoia or delusions.   Pt approved collateral communication with [employer] in [place], [place] and boyfriend Jomarie Longs. She has phone numbers wrote down in her personal items.  Diagnosis:  Final diagnoses:  None    Total Time spent with patient: 20 minutes  Past Psychiatric History:  Past meds: Zoloft, Paxil (jittery), and Celexa. Cymbalta and Xanax following rehab completion  Past Medical History:   Past Medical History:  Diagnosis Date   Cardiomyopathy (HCC)    Fibromyalgia    Hashimoto's disease    Polysubstance abuse (HCC)    Pulmonary embolism (HCC)    Thyroid disease     Past Surgical History:  Procedure Laterality Date   TONSILLECTOMY     Family History:  Family History  Family history unknown: Yes   Family Psychiatric  History:  Mother with anxiety and depression Maternal grandmother with bipolar disorder Paternal side: Cousin completed suicide  Social History:  Social History   Substance and Sexual Activity  Alcohol Use Never     Social History   Substance and Sexual Activity  Drug Use Yes   Types: Methamphetamines, Marijuana   Comment: IV drug use    Social History   Socioeconomic History   Marital status: Divorced    Spouse name: Not on file   Number of children: Not on file   Years of education: Not on file   Highest education level: Not on file  Occupational History   Not on file  Tobacco Use   Smoking status: Every Day    Packs/day: 0.50    Types: Cigarettes   Smokeless tobacco: Never  Vaping Use   Vaping Use: Never used  Substance and Sexual Activity   Alcohol use: Never   Drug use: Yes    Types: Methamphetamines, Marijuana    Comment: IV drug use   Sexual activity: Not on file  Other Topics Concern   Not on file  Social History Narrative   Not on file   Social Determinants of Health   Financial Resource Strain: Not on file  Food Insecurity: Not on file  Transportation Needs: Not on file  Physical Activity: Not on file  Stress: Not on file  Social Connections: Not on file   SDOH:  SDOH Screenings   Depression (PHQ2-9): High Risk (11/07/2022)  Tobacco Use: High Risk (11/06/2022)   Additional Social History:  Pt currently homeless. Pt reporting use of cocaine, marijuana, and opioids.   Sleep: Poor, admitted late to the unit  Appetite:  Fair  Current Medications:  Current Facility-Administered Medications   Medication Dose Route Frequency Provider Last Rate Last Admin   acetaminophen (TYLENOL) tablet 650 mg  650 mg Oral Q6H PRN Ajibola, Ene A, NP       alum & mag hydroxide-simeth (MAALOX/MYLANTA) 200-200-20 MG/5ML suspension 30 mL  30 mL Oral Q4H PRN Ajibola, Ene A, NP       dicyclomine (BENTYL) tablet 20 mg  20 mg Oral Q6H PRN Ajibola, Ene A, NP       hydrOXYzine (ATARAX) tablet 25 mg  25 mg Oral Q6H PRN Ajibola, Ene A, NP       loperamide (IMODIUM) capsule 2-4 mg  2-4 mg Oral PRN Ajibola, Ene A, NP       magnesium hydroxide (MILK OF MAGNESIA) suspension 30 mL  30 mL Oral Daily PRN Ajibola, Ene A, NP       methocarbamol (ROBAXIN) tablet 500 mg  500 mg Oral Q8H PRN Ajibola, Ene A, NP       naproxen (NAPROSYN) tablet 500 mg  500 mg Oral BID PRN Ajibola, Ene A, NP       ondansetron (ZOFRAN-ODT) disintegrating tablet 4 mg  4 mg Oral Q6H PRN Ajibola, Ene A, NP       traZODone (DESYREL) tablet 50 mg  50 mg Oral QHS PRN Ajibola, Ene A, NP       Current Outpatient Medications  Medication Sig Dispense Refill   acetaminophen (TYLENOL) 500 MG tablet Take 500-1,000 mg by mouth every 6 (six) hours as needed for mild pain or moderate pain.     ibuprofen (ADVIL) 200 MG tablet Take 200-800 mg by mouth every 6 (six) hours as needed for mild pain or moderate pain.      Labs  Lab Results:  Admission on 11/06/2022, Discharged on 11/07/2022  Component Date Value Ref Range Status   WBC 11/06/2022 6.3  4.0 - 10.5 K/uL Final   RBC 11/06/2022 5.25 (H)  3.87 - 5.11 MIL/uL Final   Hemoglobin 11/06/2022 15.2 (H)  12.0 - 15.0 g/dL Final   HCT 91/47/8295 47.1 (H)  36.0 - 46.0 % Final   MCV 11/06/2022 89.7  80.0 - 100.0 fL Final   MCH 11/06/2022 29.0  26.0 - 34.0 pg Final   MCHC 11/06/2022 32.3  30.0 - 36.0 g/dL Final   RDW 62/13/0865 13.4  11.5 - 15.5 % Final   Platelets 11/06/2022 255  150 - 400 K/uL Final   nRBC 11/06/2022 0.0  0.0 - 0.2 % Final   Neutrophils Relative % 11/06/2022 45  % Final   Neutro Abs  11/06/2022 2.9  1.7 - 7.7 K/uL Final   Lymphocytes Relative 11/06/2022 37  % Final   Lymphs Abs 11/06/2022 2.3  0.7 - 4.0 K/uL Final   Monocytes Relative 11/06/2022 9  % Final   Monocytes Absolute 11/06/2022 0.6  0.1 - 1.0 K/uL Final   Eosinophils  Relative 11/06/2022 8  % Final   Eosinophils Absolute 11/06/2022 0.5  0.0 - 0.5 K/uL Final   Basophils Relative 11/06/2022 1  % Final   Basophils Absolute 11/06/2022 0.0  0.0 - 0.1 K/uL Final   Immature Granulocytes 11/06/2022 0  % Final   Abs Immature Granulocytes 11/06/2022 0.01  0.00 - 0.07 K/uL Final   Performed at [facility], [address] [place], [place] [address]   Sodium 11/06/2022 138  135 - 145 mmol/L Final   Potassium 11/06/2022 4.0  3.5 - 5.1 mmol/L Final   Chloride 11/06/2022 100  98 - 111 mmol/L Final   CO2 11/06/2022 29  22 - 32 mmol/L Final   Glucose, Bld 11/06/2022 119 (H)  70 - 99 mg/dL Final   Glucose reference range applies only to samples taken after fasting for at least 8 hours.   BUN 11/06/2022 16  6 - 20 mg/dL Final   Creatinine, Ser 11/06/2022 1.12 (H)  0.44 - 1.00 mg/dL Final   Calcium 19/14/782912/08/2022 9.1  8.9 - 10.3 mg/dL Final   Total Protein 56/21/308612/08/2022 7.2  6.5 - 8.1 g/dL Final   Albumin 57/84/696212/08/2022 3.6  3.5 - 5.0 g/dL Final   AST 95/28/413212/08/2022 94 (H)  15 - 41 U/L Final   ALT 11/06/2022 176 (H)  0 - 44 U/L Final   Alkaline Phosphatase 11/06/2022 77  38 - 126 U/L Final   Total Bilirubin 11/06/2022 0.3  0.3 - 1.2 mg/dL Final   GFR, Estimated 11/06/2022 >60  >60 mL/min Final   Comment: (NOTE) Calculated using the CKD-EPI Creatinine Equation (2021)    Anion gap 11/06/2022 9  5 - 15 Final   Performed at [facility], [address] [place], [place] [address]   Alcohol, Ethyl (B) 11/06/2022 <10  <10 mg/dL Final   Comment: (NOTE) Lowest detectable limit for serum alcohol is 10 mg/dL.  For medical purposes only. Performed at [facility], [address] [place], [place] [address]    Opiates  11/07/2022 POSITIVE (A)  NONE DETECTED Final   Cocaine 11/07/2022 POSITIVE (A)  NONE DETECTED Final   Benzodiazepines 11/07/2022 NONE DETECTED  NONE DETECTED Final   Amphetamines 11/07/2022 NONE DETECTED  NONE DETECTED Final   Tetrahydrocannabinol 11/07/2022 POSITIVE (A)  NONE DETECTED Final   Barbiturates 11/07/2022 NONE DETECTED  NONE DETECTED Final   Comment: (NOTE) DRUG SCREEN FOR MEDICAL PURPOSES ONLY.  IF CONFIRMATION IS NEEDED FOR ANY PURPOSE, NOTIFY LAB WITHIN 5 DAYS.  LOWEST DETECTABLE LIMITS FOR URINE DRUG SCREEN Drug Class                     Cutoff (ng/mL) Amphetamine and metabolites    1000 Barbiturate and metabolites    200 Benzodiazepine                 200 Opiates and metabolites        300 Cocaine and metabolites        300 THC                            50 Performed at [facility], [address] [place], [place] [address]    I-stat hCG, quantitative 11/06/2022 <5.0  <5 mIU/mL Final   Comment 3 11/06/2022          Final   Comment:   GEST. AGE      CONC.  (  mIU/mL)   <=1 WEEK        5 - 50     2 WEEKS       50 - 500     3 WEEKS       100 - 10,000     4 WEEKS     1,000 - 30,000        FEMALE AND NON-PREGNANT FEMALE:     LESS THAN 5 mIU/mL    SARS Coronavirus 2 by RT PCR 11/06/2022 NEGATIVE  NEGATIVE Final   Comment: (NOTE) SARS-CoV-2 target nucleic acids are NOT DETECTED.  The SARS-CoV-2 RNA is generally detectable in upper respiratory specimens during the acute phase of infection. The lowest concentration of SARS-CoV-2 viral copies this assay can detect is 138 copies/mL. A negative result does not preclude SARS-Cov-2 infection and should not be used as the sole basis for treatment or other patient management decisions. A negative result may occur with  improper specimen collection/handling, submission of specimen other than nasopharyngeal swab, presence of viral mutation(s) within the areas targeted by this assay, and inadequate number of  viral copies(<138 copies/mL). A negative result must be combined with clinical observations, patient history, and epidemiological information. The expected result is Negative.  Fact Sheet for Patients:  BloggerCourse.com  Fact Sheet for Healthcare Providers:  SeriousBroker.it  This test is no                          t yet approved or cleared by the [place] FDA and  has been authorized for detection and/or diagnosis of SARS-CoV-2 by FDA under an Emergency Use Authorization (EUA). This EUA will remain  in effect (meaning this test can be used) for the duration of the COVID-19 declaration under Section 564(b)(1) of the Act, 21 U.S.C.section 360bbb-3(b)(1), unless the authorization is terminated  or revoked sooner.       Influenza A by PCR 11/06/2022 NEGATIVE  NEGATIVE Final   Influenza B by PCR 11/06/2022 NEGATIVE  NEGATIVE Final   Comment: (NOTE) The Xpert Xpress SARS-CoV-2/FLU/RSV plus assay is intended as an aid in the diagnosis of influenza from Nasopharyngeal swab specimens and should not be used as a sole basis for treatment. Nasal washings and aspirates are unacceptable for Xpert Xpress SARS-CoV-2/FLU/RSV testing.  Fact Sheet for Patients: BloggerCourse.com  Fact Sheet for Healthcare Providers: SeriousBroker.it  This test is not yet approved or cleared by the [place] FDA and has been authorized for detection and/or diagnosis of SARS-CoV-2 by FDA under an Emergency Use Authorization (EUA). This EUA will remain in effect (meaning this test can be used) for the duration of the COVID-19 declaration under Section 564(b)(1) of the Act, 21 U.S.C. section 360bbb-3(b)(1), unless the authorization is terminated or revoked.     Resp Syncytial Virus by PCR 11/06/2022 NEGATIVE  NEGATIVE Final   Comment: (NOTE) Fact Sheet for  Patients: BloggerCourse.com  Fact Sheet for Healthcare Providers: SeriousBroker.it  This test is not yet approved or cleared by the [place] FDA and has been authorized for detection and/or diagnosis of SARS-CoV-2 by FDA under an Emergency Use Authorization (EUA). This EUA will remain in effect (meaning this test can be used) for the duration of the COVID-19 declaration under Section 564(b)(1) of the Act, 21 U.S.C. section 360bbb-3(b)(1), unless the authorization is terminated or revoked.  Performed at [facility], [address] [place], [place] [address]     Blood Alcohol level:  Lab Results  Component Value Date   ETH <10 11/06/2022   ETH <10 01/24/2020    Metabolic Disorder Labs: No results found for: "HGBA1C", "MPG" No results found for: "PROLACTIN" No results found for: "CHOL", "TRIG", "HDL", "CHOLHDL", "VLDL", "LDLCALC"  Therapeutic Lab Levels: No results found for: "LITHIUM" No results found for: "VALPROATE" No results found for: "CBMZ"  Physical Findings   PHQ2-9    Flowsheet Row ED from 11/07/2022 in [facility]  PHQ-2 Total Score 4  PHQ-9 Total Score 18      Flowsheet Row ED from 11/07/2022 in [facility] ED from 11/06/2022 in [facility] EMERGENCY [facility]PART[facility] ED from 01/15/2021 in [facility] EMERGENCY DEPARTMENT  C-SSRS RISK CATEGORY No Risk Low Risk Error: Question 6 not populated        Musculoskeletal  Strength & Muscle Tone: within normal limits Gait & Station: normal Patient leans: N/A  Psychiatric Specialty Exam  Presentation  General Appearance:  Disheveled  Eye Contact: Good  Speech: Clear and Coherent  Speech Volume: Normal  Handedness: Right   Mood and Affect  Mood: Depressed  Affect: Congruent   Thought Process  Thought Processes: Coherent  Descriptions of  Associations:Intact  Orientation:Full (Time, Place and Person)  Thought Content:WDL  Diagnosis of Schizophrenia or Schizoaffective disorder in past: No    Hallucinations:Hallucinations: None  Ideas of Reference:None  Suicidal Thoughts:Suicidal Thoughts: No  Homicidal Thoughts:Homicidal Thoughts: No   Sensorium  Memory: Immediate Good; Remote Good; Recent Good  Judgment: Fair  Insight: Good   Executive Functions  Concentration: Good  Attention Span: Good  Recall: Good  Fund of Knowledge: Good  Language: Good   Psychomotor Activity  Psychomotor Activity: Psychomotor Activity: Normal   Assets  Assets: Communication Skills; Desire for Improvement   Sleep  Sleep: Sleep: Fair Number of Hours of Sleep: 5   Nutritional Assessment (For OBS and [facility] admissions only) Has the patient had a weight loss or gain of 10 pounds or more in the last 3 months?: No Has the patient had a decrease in food intake/or appetite?: No Does the patient have dental problems?: No Does the patient have eating habits or behaviors that may be indicators of an eating disorder including binging or inducing vomiting?: No Has the patient recently lost weight without trying?: 0 Has the patient been eating poorly because of a decreased appetite?: 0 Malnutrition Screening Tool Score: 0    Physical Exam  Physical Exam Constitutional:      Appearance: Normal appearance.  Pulmonary:     Effort: Pulmonary effort is normal.  Musculoskeletal:     Cervical back: Normal range of motion.  Neurological:     Mental Status: She is alert.  Psychiatric:        Attention and Perception: Attention normal.        Mood and Affect: Mood is depressed.        Speech: Speech normal.        Behavior: Behavior normal.        Thought Content: Thought content is not paranoid. Thought content includes suicidal ideation. Thought content does not include suicidal plan.        Cognition and Memory:  Cognition normal.        Judgment: Judgment normal.    Review of Systems  Constitutional:  Positive for malaise/fatigue. Negative for fever.  Respiratory:  Positive for cough and sputum production. Negative for hemoptysis.   Gastrointestinal:  Positive for nausea. Negative for  diarrhea and vomiting.  Genitourinary:  Negative for dysuria.  Musculoskeletal:  Positive for myalgias.  Neurological:  Positive for headaches.  Psychiatric/Behavioral:  Positive for depression, substance abuse and suicidal ideas. Negative for hallucinations. The patient is nervous/anxious.    Blood pressure 99/77, pulse 70, temperature 97.9 F (36.6 C), temperature source Oral, resp. rate 18, SpO2 99 %. There is no height or weight on file to calculate BMI.  Treatment Plan Summary: Patient admitted to the [facility] for substance abuse treatment and stabilization. COWS protocol UDS + for opiates, cocaine, and THC. Elevated LFT with history of untreated HepC. Encourage patient to go to groups. Plan for patient to work with social work to establish inpatient facility placement.   Labs pending -Hep panel -TSH and T4,free -HIV antibody   Other PRNs -Maalox 30 ml p.o. every 4 hours as needed indigestion -MOM 30 ml p.o. daily as needed constipation -Melatonin 5 mg p.o. nightly as needed sleep -Hydroxyzine 50 mg as needed anxiety -Trazodone 50 mg as needed sleep -Tylenol 650 mg p.o. every 6 hours as needed aches and pains   clonidine protocol detox meds and prns -Bentyl 20 mg p.o. every 6 hours as needed spasms, abdominal cramps -Atarax 25 mg p.o. 3 times daily as needed anxiety -Imodium 2- 4 mg p.o. as needed diarrhea or loose stools -Robaxin 500 mg p.o. every 8 hours as needed muscle spasms -Naproxen 500 mg p.o. twice daily as needed aches and pains -Zofran ODT 4 mg p.o. every 6 hours as needed nausea, vomiting   Kathalene Frames, Medical Student 11/07/2022 10:49 AM    Kathalene Frames, Medical  Student 11/07/22 1128    Kathalene Frames, Medical Student 11/07/22 1131

## 2022-11-07 NOTE — ED Notes (Addendum)
While completing [facility] admission paperwork, pt asked if she is voluntary so that she can leave. Pt states she doesn't want to be here if she can't get Suboxone and doesn't want to be anywhere that does group therapy because she is "going to be dope sick." Theda Belfast, RN/AC and Cecilio Asper, NP notified.

## 2022-11-07 NOTE — BH Assessment (Signed)
Pt has been accepted to [facility] at [facility]. Accepting is Cecilio Asper, NP. The number for RN report is [phone]. Pt will need to be transferred before 0500 or after 0800. Notified Sharilyn Sites, PA-C and Riley Nearing, RN of acceptance.   Pamalee Leyden, [facility], [facility] Triage Specialist [phone]
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
## 2022-11-07 NOTE — ED Notes (Signed)
Pt refuse to come to group 

## 2022-11-07 NOTE — ED Notes (Signed)
Pt resting in bed. A&O x4, calm and cooperative. Denies current SI/HI/AVH. No signs of distress noted. Monitoring for safety. 

## 2022-11-07 NOTE — ED Notes (Signed)
Pt admitted to [facility] for substance use. Pt A&O x4, cooperative but irritable. Pt tolerated admission process and skin assessment well. Pt ambulated independently to unit. Oriented to unit/staff. Snack and water provided. No signs of acute distress noted. Monitoring for safety.

## 2022-11-07 NOTE — ED Notes (Signed)
Pt was given breakfast  ?

## 2022-11-07 NOTE — ED Notes (Signed)
Pt requesting food. "I need help with my addiction". I asked pt when the last time she used? "Just a few mins ago in the parking lot before I checked in. I'm high now so I feel good, tomorrow I will start to feel like shit".

## 2022-11-07 NOTE — Progress Notes (Signed)
LCSW attempted to meet with patient on today to discuss reason for admission and goals. However, patient reported she was not feeling well and asked if LCSW could come back at a later time to speak with her. Patient aware that LCSW will follow up on tomorrow to assess needs.   Fernande Boyden, LCSW Clinical Social Worker [place] [facility] Ph: [phone]

## 2022-11-07 NOTE — ED Notes (Signed)
Belongings including suitcase and 2 bags placed in purple pod lockers #9 and #10

## 2022-11-08 DIAGNOSIS — F1290 Cannabis use, unspecified, uncomplicated: Secondary | ICD-10-CM | POA: Diagnosis not present

## 2022-11-08 DIAGNOSIS — F1910 Other psychoactive substance abuse, uncomplicated: Secondary | ICD-10-CM | POA: Diagnosis not present

## 2022-11-08 DIAGNOSIS — F1414 Cocaine abuse with cocaine-induced mood disorder: Secondary | ICD-10-CM | POA: Diagnosis not present

## 2022-11-08 DIAGNOSIS — F1114 Opioid abuse with opioid-induced mood disorder: Secondary | ICD-10-CM | POA: Diagnosis not present

## 2022-11-08 MED ORDER — NICOTINE POLACRILEX 2 MG MT GUM
4.0000 mg | CHEWING_GUM | OROMUCOSAL | Status: DC | PRN
  Administered 2022-11-08: 4 mg via ORAL
  Filled 2022-11-08: qty 2

## 2022-11-08 NOTE — ED Notes (Signed)
Pt sleeping in no acute distress. RR even and unlabored. Environment secured. Will continue to monitor for safety. 

## 2022-11-08 NOTE — ED Notes (Signed)
Patient A&Ox4. Denies intent to harm self/others when asked. Denies A/VH. Pt was asleep upon Clinical research associate entering room. Pt states, "I felt like shit this morning. Now, I just want to get some sleep. I didn't sleep too good last night. Everything hurt. I was going through withdrawals". Pt Support and encouragement provided. Routine safety checks conducted according to facility protocol. Encouraged patient to notify staff if thoughts of harm toward self or others arise. Patient verbalize understanding and agreement. Will continue to monitor for safety.

## 2022-11-08 NOTE — ED Notes (Signed)
Pt called friend to pick her up from facility today. Pt approached nurses station requesting to discharge. MD notified. Pt states her ride will be here in approx. 30 min. Pt states, "I have to go somewhere where I can get some Suboxone. This place won't give me any". Encouraged pt to continue to seek tx. Pt tearful, stating, I can't do this without Suboxone. Support provided. MD preparing discharge. Safety maintained.

## 2022-11-08 NOTE — ED Notes (Signed)
Patient did not want to attend group today.

## 2022-11-08 NOTE — ED Notes (Signed)
Pt asleep in bed. Respirations even and unlabored. Monitoring for safety. 

## 2022-11-08 NOTE — ED Provider Notes (Signed)
FBC/OBS ASAP Discharge Summary  Date and Time: 11/08/2022 2:36 PM  Name: Linda OmsHeather Lawless  MRN:  [id number]   Discharge Diagnoses:  Final diagnoses:  Substance abuse (HCC)  Substance induced mood disorder (HCC)    Subjective: Linda [name] is a  36 y.o. female with hx of polysubstance abuse, PTSD, and Hashimoto's [facility]hab treatment for opioid use.  Stay Summary: The patient was evaluated each day by a clinical provider to ascertain response to treatment. Improvement was noted by the patient's report of decreasing symptoms, and improved sleep and appetite. Patient did not participate appropriately in unit programming.  Patient was asked each day to complete a self inventory noting mood, mental status, pain, new symptoms, anxiety and concerns.   Patient was initially motivated for recovery but requested discharge prior to having next level of care established.    On day of discharge withdrawal symptoms were reported as decreased. The patient denied SI/HI and voiced no AVH.   Total Time spent with patient: 20 minutes  Past Psychiatric History:  Diagnoses: PTSD, polysubstance use. Past meds: Zoloft, Paxil (jittery), and Celexa. Cymbalta and Xanax following rehab completion   Past Medical History:  Past Medical History:  Diagnosis Date   Cardiomyopathy (HCC)    Fibromyalgia    Hashimoto's disease    Polysubstance abuse (HCC)    Pulmonary embolism (HCC)    Thyroid disease     Past Surgical History:  Procedure Laterality Date   TONSILLECTOMY     Family History:  Family History  Family history unknown: Yes   Family Psychiatric History: Mother with anxiety and depression Maternal grandmother with bipolar disorder Paternal side: Cousin completed suicide  Social History:  Social History   Substance and Sexual Activity  Alcohol Use Never     Social History   Substance and Sexual Activity  Drug Use Yes   Types: Methamphetamines,  Marijuana   Comment: IV drug use    Social History   Socioeconomic History   Marital status: Divorced    Spouse name: Not on file   Number of children: Not on file   Years of education: Not on file   Highest education level: Not on file  Occupational History   Not on file  Tobacco Use   Smoking status: Every Day    Packs/day: 0.50    Types: Cigarettes   Smokeless tobacco: Never  Vaping Use   Vaping Use: Never used  Substance and Sexual Activity   Alcohol use: Never   Drug use: Yes    Types: Methamphetamines, Marijuana    Comment: IV drug use   Sexual activity: Not on file  Other Topics Concern   Not on file  Social History Narrative   Not on file   Social Determinants of Health   Financial Resource Strain: Not on file  Food Insecurity: Not on file  Transportation Needs: Not on file  Physical Activity: Not on file  Stress: Not on file  Social Connections: Not on file   SDOH:  SDOH Screenings   Depression (PHQ2-9): High Risk (11/07/2022)  Tobacco Use: High Risk (11/06/2022)    Tobacco Cessation:  A prescription for an FDA-approved tobacco cessation medication was offered at discharge and the patient refused  Current Medications:  Current Facility-Administered Medications  Medication Dose Route Frequency Provider Last Rate Last Admin   acetaminophen (TYLENOL) tablet 650 mg  650 mg Oral Q6H PRN Ajibola, Ene A, NP       alum & mag  hydroxide-simeth (MAALOX/MYLANTA) 200-200-20 MG/5ML suspension 30 mL  30 mL Oral Q4H PRN Ajibola, Ene A, NP       dicyclomine (BENTYL) tablet 20 mg  20 mg Oral Q6H PRN Ajibola, Ene A, NP   20 mg at 11/08/22 1358   hydrOXYzine (ATARAX) tablet 25 mg  25 mg Oral Q6H PRN Ajibola, Ene A, NP   25 mg at 11/08/22 1358   loperamide (IMODIUM) capsule 2-4 mg  2-4 mg Oral PRN Ajibola, Ene A, NP       magnesium hydroxide (MILK OF MAGNESIA) suspension 30 mL  30 mL Oral Daily PRN Ajibola, Ene A, NP       methocarbamol (ROBAXIN) tablet 500 mg  500 mg  Oral Q8H PRN Ajibola, Ene A, NP   500 mg at 11/08/22 1358   naproxen (NAPROSYN) tablet 500 mg  500 mg Oral BID PRN Ajibola, Ene A, NP   500 mg at 11/08/22 0510   nicotine polacrilex (NICORETTE) gum 4 mg  4 mg Oral PRN Lamar Sprinkles, MD   4 mg at 11/08/22 1425   ondansetron (ZOFRAN-ODT) disintegrating tablet 4 mg  4 mg Oral Q6H PRN Ajibola, Ene A, NP   4 mg at 11/08/22 1358   traZODone (DESYREL) tablet 50 mg  50 mg Oral QHS PRN Ajibola, Ene A, NP       Current Outpatient Medications  Medication Sig Dispense Refill   acetaminophen (TYLENOL) 500 MG tablet Take 500-1,000 mg by mouth every 6 (six) hours as needed for mild pain or moderate pain.     ibuprofen (ADVIL) 200 MG tablet Take 200-800 mg by mouth every 6 (six) hours as needed for mild pain or moderate pain.      PTA Medications: (Not in a hospital admission)      11/07/2022    8:29 AM 11/07/2022    6:19 AM  Depression screen PHQ 2/9  Decreased Interest 2 1  Down, Depressed, Hopeless 2 1  PHQ - 2 Score 4 2  Altered sleeping 2 1  Tired, decreased energy 2 1  Change in appetite 2 1  Feeling bad or failure about yourself  2 2  Trouble concentrating 3 1  Moving slowly or fidgety/restless 1 0  Suicidal thoughts 2 0  PHQ-9 Score 18 8  Difficult doing work/chores Extremely dIfficult Somewhat difficult    Flowsheet Row ED from 11/07/2022 in [facility] ED from 11/06/2022 in [facility] EMERGENCY DEPART[facility] ED from 01/15/2021 in [facility] EMERGENCY DEPARTMENT  C-SSRS RISK CATEGORY No Risk Low Risk Error: Question 6 not populated       Musculoskeletal  Strength & Muscle Tone: within normal limits Gait & Station: normal Patient leans: N/A  Psychiatric Specialty Exam  Presentation  General Appearance:  Disheveled  Eye Contact: Good  Speech: Clear and Coherent  Speech Volume: Normal  Handedness: Right   Mood and Affect   Mood: Depressed  Affect: Congruent   Thought Process  Thought Processes: Coherent  Descriptions of Associations:Intact  Orientation:Full (Time, Place and Person)  Thought Content:WDL  Diagnosis of Schizophrenia or Schizoaffective disorder in past: No    Hallucinations:Hallucinations: None  Ideas of Reference:None  Suicidal Thoughts:Suicidal Thoughts: No  Homicidal Thoughts:Homicidal Thoughts: No   Sensorium  Memory: Immediate Good; Remote Good; Recent Good  Judgment: Fair  Insight: Good   Executive Functions  Concentration: Good  Attention Span: Good  Recall: Good  Fund of Knowledge: Good  Language: Good   Psychomotor Activity  Psychomotor Activity: Psychomotor Activity: Normal   Assets  Assets: Communication Skills; Desire for Improvement   Sleep  Sleep: Sleep: Fair Number of Hours of Sleep: 5   Nutritional Assessment (For OBS and FBC admissions only) Has the patient had a weight loss or gain of 10 pounds or more in the last 3 months?: No Has the patient had a decrease in food intake/or appetite?: No Does the patient have dental problems?: No Does the patient have eating habits or behaviors that may be indicators of an eating disorder including binging or inducing vomiting?: No Has the patient recently lost weight without trying?: 0 Has the patient been eating poorly because of a decreased appetite?: 0 Malnutrition Screening Tool Score: 0    Physical Exam  Physical Exam Constitutional:      Appearance: Normal appearance.  Pulmonary:     Effort: Pulmonary effort is normal.  Musculoskeletal:     Cervical back: Normal range of motion.  Neurological:     Mental Status: She is alert.  Psychiatric:        Attention and Perception: Attention normal.        Mood and Affect: Mood is depressed.        Speech: Speech normal.        Behavior: Behavior normal.        Thought Content: Thought content is not paranoid. Thought  content includes suicidal ideation. Thought content does not include suicidal plan.        Cognition and Memory: Cognition normal.        Judgment: Judgment normal.      Review of Systems  Constitutional:  Positive for malaise/fatigue. Negative for fever.  Respiratory:  Positive for cough and sputum production. Negative for hemoptysis.   Gastrointestinal:  Positive for nausea. Negative for diarrhea and vomiting.  Genitourinary:  Negative for dysuria.  Musculoskeletal:  Positive for myalgias.  Neurological:  Positive for headaches.  Psychiatric/Behavioral:  Positive for depression, substance abuse and suicidal ideas. Negative for hallucinations. The patient is nervous/anxious.  Blood pressure (!) 146/98, pulse 84, temperature 98.9 F (37.2 C), temperature source Oral, resp. rate 20, SpO2 99 %. There is no height or weight on file to calculate BMI.  Demographic Factors:  Caucasian, Low socioeconomic status, Unemployed, and housing instability  Loss Factors: Legal issues and Financial problems/change in socioeconomic status  Historical Factors: Family history of suicide and Impulsivity  Risk Reduction Factors:   NA  Continued Clinical Symptoms:  Alcohol/Substance Abuse/Dependencies Unstable or Poor Therapeutic Relationship  Cognitive Features That Contribute To Risk:  None    Suicide Risk:  Mild:  There are no identifiable plans, no associated intent, mild dysphoria and related symptoms, good self-control (both objective and subjective assessment), few other risk factors, and identifiable protective factors, including available and accessible social support.  Plan Of Care/Follow-up recommendations:  Follow-up recommendations:  Activity:  Normal, as tolerated Diet:  Per PCP recommendation  Patient is instructed prior to discharge to: Take all medications as prescribed by her mental healthcare provider. Report any adverse effects and/or reactions from the medicines to her  outpatient provider promptly. Patient has been instructed & cautioned: To not engage in alcohol and or illegal drug use while on prescription medicines.  In the event of worsening symptoms, patient is instructed to call the crisis hotline at 988, 911 and or go to the nearest ED for appropriate evaluation and treatment of symptoms. To follow-up with her primary care provider for your other medical issues, concerns and  or health care needs.   Disposition: Home, without scheduling next level of care. AMA.  Lamar Sprinkles, MD 11/08/2022, 2:36 PM

## 2022-11-08 NOTE — Discharge Instructions (Addendum)
Please follow up with the following residential programs as referrals have already been sent:   [place]: [place]  [place]: [place] in [place], [place]: Myna Bright [phone] [facility] [place]: [place], [place]  Follow-up recommendations:  Activity:  Normal, as tolerated Diet:  Per PCP recommendation  Patient is instructed prior to discharge to: Take all medications as prescribed by her mental healthcare provider. Report any adverse effects and/or reactions from the medicines to her outpatient provider promptly. Patient has been instructed & cautioned: To not engage in alcohol and or illegal drug use while on prescription medicines.  In the event of worsening symptoms, patient is instructed to call the crisis hotline at 988, 911 and or go to the nearest ED for appropriate evaluation and treatment of symptoms. To follow-up with her primary care provider for your other medical issues, concerns and or health care needs.   Naloxone (Narcan) can help reverse an overdose when given to the victim quickly.  [facility] offers free naloxone kits and instructions/training on its use.  Add naloxone to your first aid kit and you can help save a life.   Pick up your free kit at the following locations.   [place]:  [place], [place] [address] [phone]) [facility] [place] [id number] [phone]) [facility] [place]  [place]: [place] [address] [place] [address] [phone]) [facility] [address] [place] [address] [phone])

## 2022-11-08 NOTE — ED Notes (Signed)
Pt approached nurses station asking for medication. Pt states, "I want whatever I can have to feel better (tearful). Pt c/o nausea, cold sweats and body aches. Pt given sx relief medication. Pt returned to her room to lay down. Safety maintained.

## 2022-11-08 NOTE — ED Notes (Signed)
Patient A&O x 4, ambulatory. Patient discharged to lobby per pt request. Patient verbalized understanding of all discharge instructions explained by staff, to include follow up recommendations and safety plan. Pt belongings returned to patient from locker #30 intact. Patient escorted to lobby via staff for transport to home via her friend. Safety maintained.

## 2022-11-08 NOTE — ED Notes (Signed)
Pt up to nurse's station with c/o pain "all over" 8/10, nausea, stomach cramping, and anxiety. PRN medications administered per [facility] without difficulty. Pt given snack and juice per her request. No signs of acute distress noted. Monitoring for safety.

## 2022-11-09 DIAGNOSIS — F1320 Sedative, hypnotic or anxiolytic dependence, uncomplicated: Secondary | ICD-10-CM | POA: Diagnosis not present

## 2022-11-09 DIAGNOSIS — F1120 Opioid dependence, uncomplicated: Secondary | ICD-10-CM | POA: Diagnosis not present

## 2022-11-09 DIAGNOSIS — Z79899 Other long term (current) drug therapy: Secondary | ICD-10-CM | POA: Diagnosis not present

## 2022-11-09 DIAGNOSIS — F1220 Cannabis dependence, uncomplicated: Secondary | ICD-10-CM | POA: Diagnosis not present

## 2022-11-09 DIAGNOSIS — R69 Illness, unspecified: Secondary | ICD-10-CM | POA: Diagnosis not present

## 2022-11-18 DIAGNOSIS — R69 Illness, unspecified: Secondary | ICD-10-CM | POA: Diagnosis not present

## 2022-11-19 DIAGNOSIS — R69 Illness, unspecified: Secondary | ICD-10-CM | POA: Diagnosis not present

## 2022-11-22 DIAGNOSIS — R69 Illness, unspecified: Secondary | ICD-10-CM | POA: Diagnosis not present

## 2022-11-22 DIAGNOSIS — F1320 Sedative, hypnotic or anxiolytic dependence, uncomplicated: Secondary | ICD-10-CM | POA: Diagnosis not present

## 2022-11-22 DIAGNOSIS — F1420 Cocaine dependence, uncomplicated: Secondary | ICD-10-CM | POA: Diagnosis not present

## 2022-11-23 DIAGNOSIS — R69 Illness, unspecified: Secondary | ICD-10-CM | POA: Diagnosis not present

## 2022-11-24 DIAGNOSIS — R69 Illness, unspecified: Secondary | ICD-10-CM | POA: Diagnosis not present

## 2022-11-25 DIAGNOSIS — R69 Illness, unspecified: Secondary | ICD-10-CM | POA: Diagnosis not present

## 2022-11-26 DIAGNOSIS — R69 Illness, unspecified: Secondary | ICD-10-CM | POA: Diagnosis not present

## 2022-11-28 DIAGNOSIS — R69 Illness, unspecified: Secondary | ICD-10-CM | POA: Diagnosis not present

## 2022-11-29 DIAGNOSIS — R69 Illness, unspecified: Secondary | ICD-10-CM | POA: Diagnosis not present

## 2022-11-30 DIAGNOSIS — F1420 Cocaine dependence, uncomplicated: Secondary | ICD-10-CM | POA: Diagnosis not present

## 2022-11-30 DIAGNOSIS — R69 Illness, unspecified: Secondary | ICD-10-CM | POA: Diagnosis not present

## 2022-11-30 DIAGNOSIS — F1320 Sedative, hypnotic or anxiolytic dependence, uncomplicated: Secondary | ICD-10-CM | POA: Diagnosis not present

## 2022-12-01 DIAGNOSIS — R69 Illness, unspecified: Secondary | ICD-10-CM | POA: Diagnosis not present

## 2022-12-02 DIAGNOSIS — R69 Illness, unspecified: Secondary | ICD-10-CM | POA: Diagnosis not present

## 2022-12-03 DIAGNOSIS — R69 Illness, unspecified: Secondary | ICD-10-CM | POA: Diagnosis not present

## 2022-12-05 DIAGNOSIS — R69 Illness, unspecified: Secondary | ICD-10-CM | POA: Diagnosis not present

## 2022-12-06 DIAGNOSIS — R69 Illness, unspecified: Secondary | ICD-10-CM | POA: Diagnosis not present

## 2022-12-07 DIAGNOSIS — R69 Illness, unspecified: Secondary | ICD-10-CM | POA: Diagnosis not present

## 2022-12-08 DIAGNOSIS — R69 Illness, unspecified: Secondary | ICD-10-CM | POA: Diagnosis not present

## 2022-12-09 DIAGNOSIS — R69 Illness, unspecified: Secondary | ICD-10-CM | POA: Diagnosis not present

## 2022-12-10 DIAGNOSIS — R69 Illness, unspecified: Secondary | ICD-10-CM | POA: Diagnosis not present

## 2022-12-12 DIAGNOSIS — R69 Illness, unspecified: Secondary | ICD-10-CM | POA: Diagnosis not present

## 2022-12-13 DIAGNOSIS — R69 Illness, unspecified: Secondary | ICD-10-CM | POA: Diagnosis not present

## 2022-12-14 DIAGNOSIS — R69 Illness, unspecified: Secondary | ICD-10-CM | POA: Diagnosis not present

## 2022-12-15 DIAGNOSIS — R69 Illness, unspecified: Secondary | ICD-10-CM | POA: Diagnosis not present

## 2022-12-16 DIAGNOSIS — R69 Illness, unspecified: Secondary | ICD-10-CM | POA: Diagnosis not present

## 2022-12-17 DIAGNOSIS — R69 Illness, unspecified: Secondary | ICD-10-CM | POA: Diagnosis not present

## 2022-12-19 DIAGNOSIS — R69 Illness, unspecified: Secondary | ICD-10-CM | POA: Diagnosis not present

## 2022-12-20 DIAGNOSIS — R69 Illness, unspecified: Secondary | ICD-10-CM | POA: Diagnosis not present

## 2022-12-21 DIAGNOSIS — R69 Illness, unspecified: Secondary | ICD-10-CM | POA: Diagnosis not present

## 2022-12-22 DIAGNOSIS — R69 Illness, unspecified: Secondary | ICD-10-CM | POA: Diagnosis not present

## 2022-12-23 DIAGNOSIS — R69 Illness, unspecified: Secondary | ICD-10-CM | POA: Diagnosis not present

## 2022-12-26 DIAGNOSIS — R69 Illness, unspecified: Secondary | ICD-10-CM | POA: Diagnosis not present

## 2022-12-26 DIAGNOSIS — F1320 Sedative, hypnotic or anxiolytic dependence, uncomplicated: Secondary | ICD-10-CM | POA: Diagnosis not present

## 2022-12-26 DIAGNOSIS — F1420 Cocaine dependence, uncomplicated: Secondary | ICD-10-CM | POA: Diagnosis not present

## 2022-12-28 DIAGNOSIS — F1320 Sedative, hypnotic or anxiolytic dependence, uncomplicated: Secondary | ICD-10-CM | POA: Diagnosis not present

## 2022-12-28 DIAGNOSIS — F1420 Cocaine dependence, uncomplicated: Secondary | ICD-10-CM | POA: Diagnosis not present

## 2022-12-28 DIAGNOSIS — R69 Illness, unspecified: Secondary | ICD-10-CM | POA: Diagnosis not present

## 2022-12-30 DIAGNOSIS — R69 Illness, unspecified: Secondary | ICD-10-CM | POA: Diagnosis not present

## 2023-01-04 DIAGNOSIS — R69 Illness, unspecified: Secondary | ICD-10-CM | POA: Diagnosis not present

## 2023-01-06 DIAGNOSIS — R69 Illness, unspecified: Secondary | ICD-10-CM | POA: Diagnosis not present

## 2023-01-18 DIAGNOSIS — R69 Illness, unspecified: Secondary | ICD-10-CM | POA: Diagnosis not present

## 2023-01-18 DIAGNOSIS — O903 Peripartum cardiomyopathy: Secondary | ICD-10-CM | POA: Diagnosis not present

## 2023-01-18 DIAGNOSIS — F324 Major depressive disorder, single episode, in partial remission: Secondary | ICD-10-CM | POA: Diagnosis not present

## 2023-01-18 DIAGNOSIS — Z8639 Personal history of other endocrine, nutritional and metabolic disease: Secondary | ICD-10-CM | POA: Diagnosis not present

## 2023-01-18 DIAGNOSIS — F1120 Opioid dependence, uncomplicated: Secondary | ICD-10-CM | POA: Diagnosis not present

## 2023-01-18 DIAGNOSIS — Z113 Encounter for screening for infections with a predominantly sexual mode of transmission: Secondary | ICD-10-CM | POA: Diagnosis not present

## 2023-01-18 DIAGNOSIS — B182 Chronic viral hepatitis C: Secondary | ICD-10-CM | POA: Diagnosis not present

## 2023-01-18 DIAGNOSIS — F1520 Other stimulant dependence, uncomplicated: Secondary | ICD-10-CM | POA: Diagnosis not present

## 2023-01-27 DIAGNOSIS — E042 Nontoxic multinodular goiter: Secondary | ICD-10-CM | POA: Diagnosis not present

## 2023-01-27 DIAGNOSIS — Z8639 Personal history of other endocrine, nutritional and metabolic disease: Secondary | ICD-10-CM | POA: Diagnosis not present

## 2023-01-27 DIAGNOSIS — B182 Chronic viral hepatitis C: Secondary | ICD-10-CM | POA: Diagnosis not present

## 2023-01-27 DIAGNOSIS — F1520 Other stimulant dependence, uncomplicated: Secondary | ICD-10-CM | POA: Diagnosis not present

## 2023-01-27 DIAGNOSIS — F324 Major depressive disorder, single episode, in partial remission: Secondary | ICD-10-CM | POA: Diagnosis not present

## 2023-01-27 DIAGNOSIS — Z6825 Body mass index (BMI) 25.0-25.9, adult: Secondary | ICD-10-CM | POA: Diagnosis not present

## 2023-01-27 DIAGNOSIS — F1120 Opioid dependence, uncomplicated: Secondary | ICD-10-CM | POA: Diagnosis not present

## 2023-01-27 DIAGNOSIS — M797 Fibromyalgia: Secondary | ICD-10-CM | POA: Diagnosis not present

## 2023-02-03 DIAGNOSIS — Z6825 Body mass index (BMI) 25.0-25.9, adult: Secondary | ICD-10-CM | POA: Diagnosis not present

## 2023-02-03 DIAGNOSIS — F1120 Opioid dependence, uncomplicated: Secondary | ICD-10-CM | POA: Diagnosis not present

## 2023-02-03 DIAGNOSIS — F324 Major depressive disorder, single episode, in partial remission: Secondary | ICD-10-CM | POA: Diagnosis not present

## 2023-02-03 DIAGNOSIS — E042 Nontoxic multinodular goiter: Secondary | ICD-10-CM | POA: Diagnosis not present

## 2023-02-03 DIAGNOSIS — F1520 Other stimulant dependence, uncomplicated: Secondary | ICD-10-CM | POA: Diagnosis not present

## 2023-02-03 DIAGNOSIS — B182 Chronic viral hepatitis C: Secondary | ICD-10-CM | POA: Diagnosis not present

## 2023-02-03 DIAGNOSIS — Z8639 Personal history of other endocrine, nutritional and metabolic disease: Secondary | ICD-10-CM | POA: Diagnosis not present

## 2023-02-20 DIAGNOSIS — E042 Nontoxic multinodular goiter: Secondary | ICD-10-CM | POA: Diagnosis not present

## 2023-02-20 DIAGNOSIS — Z113 Encounter for screening for infections with a predominantly sexual mode of transmission: Secondary | ICD-10-CM | POA: Diagnosis not present

## 2023-02-20 DIAGNOSIS — B182 Chronic viral hepatitis C: Secondary | ICD-10-CM | POA: Diagnosis not present

## 2023-03-01 DIAGNOSIS — B182 Chronic viral hepatitis C: Secondary | ICD-10-CM | POA: Diagnosis not present

## 2023-03-03 DIAGNOSIS — F1120 Opioid dependence, uncomplicated: Secondary | ICD-10-CM | POA: Diagnosis not present

## 2023-03-03 DIAGNOSIS — E042 Nontoxic multinodular goiter: Secondary | ICD-10-CM | POA: Diagnosis not present

## 2023-03-03 DIAGNOSIS — B182 Chronic viral hepatitis C: Secondary | ICD-10-CM | POA: Diagnosis not present

## 2023-03-03 DIAGNOSIS — F324 Major depressive disorder, single episode, in partial remission: Secondary | ICD-10-CM | POA: Diagnosis not present

## 2023-03-13 DIAGNOSIS — E042 Nontoxic multinodular goiter: Secondary | ICD-10-CM | POA: Diagnosis not present

## 2023-03-29 DIAGNOSIS — M797 Fibromyalgia: Secondary | ICD-10-CM | POA: Diagnosis not present

## 2023-03-29 DIAGNOSIS — E042 Nontoxic multinodular goiter: Secondary | ICD-10-CM | POA: Diagnosis not present

## 2023-03-29 DIAGNOSIS — B182 Chronic viral hepatitis C: Secondary | ICD-10-CM | POA: Diagnosis not present

## 2023-03-29 DIAGNOSIS — F1120 Opioid dependence, uncomplicated: Secondary | ICD-10-CM | POA: Diagnosis not present

## 2023-03-29 DIAGNOSIS — F324 Major depressive disorder, single episode, in partial remission: Secondary | ICD-10-CM | POA: Diagnosis not present

## 2023-05-03 DIAGNOSIS — B182 Chronic viral hepatitis C: Secondary | ICD-10-CM | POA: Diagnosis not present

## 2023-05-03 DIAGNOSIS — F1120 Opioid dependence, uncomplicated: Secondary | ICD-10-CM | POA: Diagnosis not present

## 2023-05-10 DIAGNOSIS — R41840 Attention and concentration deficit: Secondary | ICD-10-CM | POA: Diagnosis not present

## 2023-05-17 DIAGNOSIS — K5900 Constipation, unspecified: Secondary | ICD-10-CM | POA: Diagnosis not present

## 2023-05-17 DIAGNOSIS — Z8249 Family history of ischemic heart disease and other diseases of the circulatory system: Secondary | ICD-10-CM | POA: Diagnosis not present

## 2023-05-17 DIAGNOSIS — O903 Peripartum cardiomyopathy: Secondary | ICD-10-CM | POA: Diagnosis not present

## 2023-05-17 DIAGNOSIS — K047 Periapical abscess without sinus: Secondary | ICD-10-CM | POA: Diagnosis not present

## 2023-05-23 DIAGNOSIS — R5383 Other fatigue: Secondary | ICD-10-CM | POA: Diagnosis not present

## 2023-05-23 DIAGNOSIS — J029 Acute pharyngitis, unspecified: Secondary | ICD-10-CM | POA: Diagnosis not present

## 2023-05-23 DIAGNOSIS — M791 Myalgia, unspecified site: Secondary | ICD-10-CM | POA: Diagnosis not present

## 2023-05-23 DIAGNOSIS — R509 Fever, unspecified: Secondary | ICD-10-CM | POA: Diagnosis not present

## 2023-05-23 DIAGNOSIS — B349 Viral infection, unspecified: Secondary | ICD-10-CM | POA: Diagnosis not present

## 2023-06-06 DIAGNOSIS — B182 Chronic viral hepatitis C: Secondary | ICD-10-CM | POA: Diagnosis not present

## 2023-06-06 DIAGNOSIS — R110 Nausea: Secondary | ICD-10-CM | POA: Diagnosis not present

## 2023-06-06 DIAGNOSIS — F1120 Opioid dependence, uncomplicated: Secondary | ICD-10-CM | POA: Diagnosis not present

## 2024-01-06 ENCOUNTER — Other Ambulatory Visit: Payer: Self-pay

## 2024-01-06 ENCOUNTER — Emergency Department (HOSPITAL_BASED_OUTPATIENT_CLINIC_OR_DEPARTMENT_OTHER)
Admission: EM | Admit: 2024-01-06 | Discharge: 2024-01-06 | Disposition: A | Discharge status: Home / Self Care | Payer: MEDICAID | Attending: Emergency Medicine | Admitting: Emergency Medicine

## 2024-01-06 ENCOUNTER — Encounter (HOSPITAL_BASED_OUTPATIENT_CLINIC_OR_DEPARTMENT_OTHER): Payer: Self-pay

## 2024-01-06 DIAGNOSIS — J101 Influenza due to other identified influenza virus with other respiratory manifestations: Secondary | ICD-10-CM | POA: Diagnosis not present

## 2024-01-06 DIAGNOSIS — Z20822 Contact with and (suspected) exposure to covid-19: Secondary | ICD-10-CM | POA: Insufficient documentation

## 2024-01-06 DIAGNOSIS — Z91040 Latex allergy status: Secondary | ICD-10-CM | POA: Diagnosis not present

## 2024-01-06 DIAGNOSIS — M791 Myalgia, unspecified site: Secondary | ICD-10-CM | POA: Diagnosis present

## 2024-01-06 LAB — RESP PANEL BY RT-PCR (RSV, FLU A&B, COVID)  RVPGX2
Influenza A by PCR: POSITIVE — AB
Influenza B by PCR: NEGATIVE
Resp Syncytial Virus by PCR: NEGATIVE
SARS Coronavirus 2 by RT PCR: NEGATIVE

## 2024-01-06 MED ORDER — KETOROLAC TROMETHAMINE 60 MG/2ML IM SOLN
60.0000 mg | Freq: Once | INTRAMUSCULAR | Status: AC
  Administered 2024-01-06: 60 mg via INTRAMUSCULAR
  Filled 2024-01-06: qty 2

## 2024-01-06 NOTE — ED Provider Notes (Signed)
 [place] EMERGENCY DEPARTMENT AT [facility] Provider Note   CSN: [id number] Arrival date & time: 01/06/24  1146     History  Chief Complaint  Patient presents with   Generalized Body Aches    Linda [name] is a 38 y.o. female.  Flulike symptoms the last 2 days.  Works at a nursing home.  Denies any chest pain shortness of breath.  History of pulmonary embolism cardiomyopathy fibromyalgia.  She denies any weakness numbness tingling.  No nausea vomit diarrhea.  Fever last 2 days.  Body aches.  No respiratory symptoms at this time.  The history is provided by the patient.       Home Medications Prior to Admission medications   Medication Sig Start Date End Date Taking? Authorizing Provider  acetaminophen  (TYLENOL ) 500 MG tablet Take 500-1,000 mg by mouth every 6 (six) hours as needed for mild pain or moderate pain.    [provider]  ibuprofen (ADVIL) 200 MG tablet Take 200-800 mg by mouth every 6 (six) hours as needed for mild pain or moderate pain.    [provider]      Allergies    Latex    Review of Systems   Review of Systems  Physical Exam Updated Vital Signs BP 132/85 (BP Location: Right Arm)   Pulse 87   Temp 99.3 F (37.4 C) (Oral)   Resp 18   SpO2 100%  Physical Exam Vitals and nursing note reviewed.  Constitutional:      General: She is not in acute distress.    Appearance: She is well-developed. She is not ill-appearing.  HENT:     Head: Normocephalic and atraumatic.     Nose: Nose normal.     Mouth/Throat:     Mouth: Mucous membranes are moist.  Eyes:     Extraocular Movements: Extraocular movements intact.     Conjunctiva/sclera: Conjunctivae normal.     Pupils: Pupils are equal, round, and reactive to light.  Cardiovascular:     Rate and Rhythm: Normal rate and regular rhythm.     Pulses: Normal pulses.     Heart sounds: Normal heart sounds. No murmur heard. Pulmonary:     Effort: Pulmonary effort is normal.  No respiratory distress.     Breath sounds: Normal breath sounds.  Abdominal:     General: Abdomen is flat.     Palpations: Abdomen is soft.     Tenderness: There is no abdominal tenderness.  Musculoskeletal:        General: No swelling.     Cervical back: Normal range of motion and neck supple.  Skin:    General: Skin is warm and dry.     Capillary Refill: Capillary refill takes less than 2 seconds.  Neurological:     General: No focal deficit present.     Mental Status: She is alert.  Psychiatric:        Mood and Affect: Mood normal.     ED Results / Procedures / Treatments   Labs (all labs ordered are listed, but only abnormal results are displayed) Labs Reviewed  RESP PANEL BY RT-PCR (RSV, FLU A&B, COVID)  RVPGX2 - Abnormal; Notable for the following components:      Result Value   Influenza A by PCR POSITIVE (*)    All other components within normal limits    EKG EKG Interpretation Date/Time:  Saturday January 06 2024 11:56:28 EST Ventricular Rate:  92 PR Interval:  146 QRS Duration:  78  QT Interval:  346 QTC Calculation: 427 R Axis:   20  Text Interpretation: Normal sinus rhythm Right atrial enlargement Low voltage QRS Cannot rule out Anterior infarct , age undetermined Abnormal ECG Confirmed by Ruthe Cornet [phone]) on 01/06/2024 12:08:13 PM  Radiology No results found.  Procedures Procedures    Medications Ordered in ED Medications  ketorolac  (TORADOL ) injection 60 mg (has no administration in time range)    ED Course/ Medical Decision Making/ A&P                                 Medical Decision Making Risk Prescription drug management.   [name] patient here with flulike symptoms.  Positive for influenza A.  She is well-appearing.  Low-grade fever.  She has no chest pain shortness of breath cough or sputum production.  I do not think she has pneumonia.  Symptoms only for 2 days.  Recommend supportive care with Tylenol  and ibuprofen at  home.  Discharged in good condition.  Given a Toradol  shot in the ED.  This chart was dictated using voice recognition software.  Despite best efforts to proofread,  errors can occur which can change the documentation meaning.         Final Clinical Impression(s) / ED Diagnoses Final diagnoses:  Influenza A    Rx / DC Orders ED Discharge Orders     None         Ruthe Cornet, DO 01/06/24 1315

## 2024-01-06 NOTE — Discharge Instructions (Addendum)
 Continue Tylenol  and ibuprofen for fever.  Take 1000 mg of Tylenol  every 6 hours as needed.  Take 400 mg ibuprofen every 8 hours as needed.  Return if symptoms are not improved after 5 or 6 days.

## 2024-01-06 NOTE — ED Triage Notes (Signed)
 Patient presents to ED with c/o generalized weakness that started two days ago +intermittent shortness of breath, R otalgia. Denies chest pain, N/V/D, fever

## 2024-01-06 NOTE — ED Notes (Signed)
 Dc instructions reviewed with patient. Patient voiced understanding. Dc with belongings.

## 2024-01-09 ENCOUNTER — Encounter (HOSPITAL_BASED_OUTPATIENT_CLINIC_OR_DEPARTMENT_OTHER): Payer: Self-pay | Admitting: Emergency Medicine

## 2024-01-09 DIAGNOSIS — Z91040 Latex allergy status: Secondary | ICD-10-CM | POA: Insufficient documentation

## 2024-01-09 DIAGNOSIS — K2980 Duodenitis without bleeding: Secondary | ICD-10-CM | POA: Diagnosis not present

## 2024-01-09 DIAGNOSIS — R1011 Right upper quadrant pain: Secondary | ICD-10-CM | POA: Diagnosis present

## 2024-01-09 LAB — URINALYSIS, ROUTINE W REFLEX MICROSCOPIC
Bilirubin Urine: NEGATIVE
Glucose, UA: NEGATIVE mg/dL
Ketones, ur: NEGATIVE mg/dL
Leukocytes,Ua: NEGATIVE
Nitrite: POSITIVE — AB
Specific Gravity, Urine: 1.038 — ABNORMAL HIGH (ref 1.005–1.030)
pH: 6.5 (ref 5.0–8.0)

## 2024-01-09 LAB — COMPREHENSIVE METABOLIC PANEL
ALT: 29 U/L (ref 0–44)
AST: 21 U/L (ref 15–41)
Albumin: 4.2 g/dL (ref 3.5–5.0)
Alkaline Phosphatase: 61 U/L (ref 38–126)
Anion gap: 7 (ref 5–15)
BUN: 9 mg/dL (ref 6–20)
CO2: 28 mmol/L (ref 22–32)
Calcium: 8.7 mg/dL — ABNORMAL LOW (ref 8.9–10.3)
Chloride: 102 mmol/L (ref 98–111)
Creatinine, Ser: 0.59 mg/dL (ref 0.44–1.00)
GFR, Estimated: >60 mL/min (ref >60)
Glucose, Bld: 99 mg/dL (ref 70–99)
Potassium: 3.4 mmol/L — ABNORMAL LOW (ref 3.5–5.1)
Sodium: 137 mmol/L (ref 135–145)
Total Bilirubin: 0.4 mg/dL (ref 0.0–1.2)
Total Protein: 7.7 g/dL (ref 6.5–8.1)

## 2024-01-09 LAB — CBC
HCT: 41.1 % (ref 36.0–46.0)
Hemoglobin: 13.8 g/dL (ref 12.0–15.0)
MCH: 29.2 pg (ref 26.0–34.0)
MCHC: 33.6 g/dL (ref 30.0–36.0)
MCV: 86.9 fL (ref 80.0–100.0)
Platelets: 233 10*3/uL (ref 150–400)
RBC: 4.73 MIL/uL (ref 3.87–5.11)
RDW: 12 % (ref 11.5–15.5)
WBC: 6.1 10*3/uL (ref 4.0–10.5)
nRBC: 0 % (ref 0.0–0.2)

## 2024-01-09 LAB — PREGNANCY, URINE: Preg Test, Ur: NEGATIVE

## 2024-01-09 LAB — LIPASE, BLOOD: Lipase: 11 U/L (ref 11–51)

## 2024-01-09 NOTE — ED Triage Notes (Signed)
RUQ pain started last night, loss of abdo, nausea Tender,  Recent flu

## 2024-01-10 ENCOUNTER — Emergency Department (HOSPITAL_BASED_OUTPATIENT_CLINIC_OR_DEPARTMENT_OTHER): Payer: MEDICAID

## 2024-01-10 ENCOUNTER — Emergency Department (HOSPITAL_BASED_OUTPATIENT_CLINIC_OR_DEPARTMENT_OTHER)
Admission: EM | Admit: 2024-01-10 | Discharge: 2024-01-10 | Disposition: A | Discharge status: Home / Self Care | Payer: MEDICAID | Attending: Emergency Medicine | Admitting: Emergency Medicine

## 2024-01-10 DIAGNOSIS — K2980 Duodenitis without bleeding: Secondary | ICD-10-CM

## 2024-01-10 DIAGNOSIS — R109 Unspecified abdominal pain: Secondary | ICD-10-CM

## 2024-01-10 MED ORDER — PANTOPRAZOLE SODIUM 40 MG PO TBEC: 40.0000 mg | DELAYED_RELEASE_TABLET | Freq: Every day | ORAL | 1 refills | Status: DC

## 2024-01-10 MED ORDER — KETOROLAC TROMETHAMINE 30 MG/ML IJ SOLN
30.0000 mg | Freq: Once | INTRAMUSCULAR | Status: AC
  Administered 2024-01-10: 30 mg via INTRAVENOUS
  Filled 2024-01-10: qty 1

## 2024-01-10 MED ORDER — ALUM & MAG HYDROXIDE-SIMETH 200-200-20 MG/5ML PO SUSP
30.0000 mL | Freq: Once | ORAL | Status: AC
  Administered 2024-01-10: 30 mL via ORAL
  Filled 2024-01-10: qty 30

## 2024-01-10 MED ORDER — IOHEXOL 300 MG/ML  SOLN
100.0000 mL | Freq: Once | INTRAMUSCULAR | Status: AC | PRN
  Administered 2024-01-10: 85 mL via INTRAVENOUS

## 2024-01-10 MED ORDER — SODIUM CHLORIDE 0.9 % IV BOLUS
1000.0000 mL | Freq: Once | INTRAVENOUS | Status: AC
  Administered 2024-01-10: 1000 mL via INTRAVENOUS

## 2024-01-10 MED ORDER — MORPHINE SULFATE (PF) 4 MG/ML IV SOLN
4.0000 mg | Freq: Once | INTRAVENOUS | Status: AC
  Administered 2024-01-10: 4 mg via INTRAVENOUS
  Filled 2024-01-10: qty 1

## 2024-01-10 MED ORDER — LIDOCAINE VISCOUS HCL 2 % MT SOLN
15.0000 mL | Freq: Once | OROMUCOSAL | Status: AC
  Administered 2024-01-10: 15 mL via ORAL
  Filled 2024-01-10: qty 15

## 2024-01-10 MED ORDER — ONDANSETRON HCL 4 MG/2ML IJ SOLN
4.0000 mg | Freq: Once | INTRAMUSCULAR | Status: AC
  Administered 2024-01-10: 4 mg via INTRAVENOUS
  Filled 2024-01-10: qty 2

## 2024-01-10 MED ORDER — PANTOPRAZOLE SODIUM 40 MG IV SOLR
40.0000 mg | Freq: Once | INTRAVENOUS | Status: AC
  Administered 2024-01-10: 40 mg via INTRAVENOUS
  Filled 2024-01-10: qty 10

## 2024-01-10 NOTE — Discharge Instructions (Signed)
Begin taking Protonix as prescribed.  Follow-up with primary doctor if symptoms are not improving in the next week.

## 2024-01-10 NOTE — ED Notes (Signed)
Patient transported to CT

## 2024-01-10 NOTE — ED Provider Notes (Signed)
[place] EMERGENCY DEPARTMENT AT [facility] Provider Note   CSN: [id number] Arrival date & time: 01/09/24  1944     History  Chief Complaint  Patient presents with   Abdominal Pain    Linda [name] is a 38 y.o. female.  Patient is a 38 year old female with past medical history of tubal ligation, tonsillectomy, and polysubstance abuse.  Patient presenting today with complaints of abdominal pain.  She reports recently having the flu, then began today with pain to her right upper quadrant.  She describes this pain as constant and excruciating.  She denies any nausea or vomiting.  No diarrhea.  No fevers or chills.  Pain is worse with palpation and movement with no alleviating factors.  The history is provided by the patient.       Home Medications Prior to Admission medications   Medication Sig Start Date End Date Taking? Authorizing Provider  acetaminophen (TYLENOL) 500 MG tablet Take 500-1,000 mg by mouth every 6 (six) hours as needed for mild pain or moderate pain.    [provider]  ibuprofen (ADVIL) 200 MG tablet Take 200-800 mg by mouth every 6 (six) hours as needed for mild pain or moderate pain.    [provider]      Allergies    Latex    Review of Systems   Review of Systems  All other systems reviewed and are negative.   Physical Exam Updated Vital Signs BP (!) 153/90 (BP Location: Right Arm)   Pulse 66   Temp (!) 97.2 F (36.2 C) (Oral)   Resp 18   LMP 01/06/2024   SpO2 100%  Physical Exam Vitals and nursing note reviewed.  Constitutional:      General: She is not in acute distress.    Appearance: She is well-developed. She is not diaphoretic.  HENT:     Head: Normocephalic and atraumatic.  Cardiovascular:     Rate and Rhythm: Normal rate and regular rhythm.     Heart sounds: No murmur heard.    No friction rub. No gallop.  Pulmonary:     Effort: Pulmonary effort is normal. No respiratory distress.     Breath  sounds: Normal breath sounds. No wheezing.  Abdominal:     General: Bowel sounds are normal. There is no distension.     Palpations: Abdomen is soft.     Tenderness: There is abdominal tenderness in the right upper quadrant. There is no right CVA tenderness, left CVA tenderness, guarding or rebound.  Musculoskeletal:        General: Normal range of motion.     Cervical back: Normal range of motion and neck supple.  Skin:    General: Skin is warm and dry.  Neurological:     General: No focal deficit present.     Mental Status: She is alert and oriented to person, place, and time.     ED Results / Procedures / Treatments   Labs (all labs ordered are listed, but only abnormal results are displayed) Labs Reviewed  COMPREHENSIVE METABOLIC PANEL - Abnormal; Notable for the following components:      Result Value   Potassium 3.4 (*)    Calcium 8.7 (*)    All other components within normal limits  URINALYSIS, ROUTINE W REFLEX MICROSCOPIC - Abnormal; Notable for the following components:   Specific Gravity, Urine 1.038 (*)    Hgb urine dipstick SMALL (*)    Protein, ur TRACE (*)    Nitrite POSITIVE (*)  Bacteria, UA MANY (*)    All other components within normal limits  LIPASE, BLOOD  CBC  PREGNANCY, URINE    EKG None  Radiology No results found.  Procedures Procedures    Medications Ordered in ED Medications  morphine (PF) 4 MG/ML injection 4 mg (has no administration in time range)  ketorolac (TORADOL) 30 MG/ML injection 30 mg (has no administration in time range)  ondansetron (ZOFRAN) injection 4 mg (has no administration in time range)  sodium chloride 0.9 % bolus 1,000 mL (has no administration in time range)    ED Course/ Medical Decision Making/ A&P  Patient is a 38 year old female presenting with abdominal pain as described in the HPI.  Patient arrives here with stable vital signs and is afebrile.  Physical examination reveals tenderness to the right lower  quadrant, but no peritoneal signs.  Laboratory studies obtained including CBC, CMP, and lipase, all of which are unremarkable.  There is no leukocytosis, no elevation of liver or pancreatic enzymes, and no electrolyte derangement.  CT scan of the abdomen and pelvis obtained showing mild duodenitis, however no other acute process.  Patient has been given IV fluids along with morphine for pain, Zofran for nausea, and Protonix for the duodenitis.  She also received a GI cocktail and seems to be feeling somewhat better.  Patient to be discharged with Protonix and follow-up as needed.  Final Clinical Impression(s) / ED Diagnoses Final diagnoses:  None    Rx / DC Orders ED Discharge Orders     None         Geoffery Lyons, MD 01/10/24 [phone]

## 2024-01-10 NOTE — ED Notes (Signed)
Pt called rn to lobby. C/o worsening abdominal pain, remains RUQ. Rating 10/10. Denies abd surgeries. Tearful in triage. IV placed. Pt remains in triage room.

## 2024-02-14 ENCOUNTER — Inpatient Hospital Stay (HOSPITAL_BASED_OUTPATIENT_CLINIC_OR_DEPARTMENT_OTHER)
Admission: EM | Admit: 2024-02-14 | Discharge: 2024-02-17 | DRG: 644 | Disposition: A | Discharge status: Home / Self Care | Payer: MEDICAID | Attending: Family Medicine | Admitting: Family Medicine

## 2024-02-14 ENCOUNTER — Other Ambulatory Visit: Payer: Self-pay

## 2024-02-14 ENCOUNTER — Encounter (HOSPITAL_BASED_OUTPATIENT_CLINIC_OR_DEPARTMENT_OTHER): Payer: Self-pay

## 2024-02-14 DIAGNOSIS — E2740 Unspecified adrenocortical insufficiency: Secondary | ICD-10-CM | POA: Diagnosis not present

## 2024-02-14 DIAGNOSIS — M797 Fibromyalgia: Secondary | ICD-10-CM | POA: Diagnosis present

## 2024-02-14 DIAGNOSIS — F17210 Nicotine dependence, cigarettes, uncomplicated: Secondary | ICD-10-CM | POA: Diagnosis not present

## 2024-02-14 DIAGNOSIS — E063 Autoimmune thyroiditis: Secondary | ICD-10-CM | POA: Diagnosis present

## 2024-02-14 DIAGNOSIS — K2980 Duodenitis without bleeding: Secondary | ICD-10-CM | POA: Diagnosis not present

## 2024-02-14 DIAGNOSIS — B182 Chronic viral hepatitis C: Secondary | ICD-10-CM

## 2024-02-14 DIAGNOSIS — F1911 Other psychoactive substance abuse, in remission: Secondary | ICD-10-CM | POA: Diagnosis not present

## 2024-02-14 DIAGNOSIS — I429 Cardiomyopathy, unspecified: Secondary | ICD-10-CM | POA: Diagnosis not present

## 2024-02-14 DIAGNOSIS — Z86711 Personal history of pulmonary embolism: Secondary | ICD-10-CM

## 2024-02-14 DIAGNOSIS — E162 Hypoglycemia, unspecified: Secondary | ICD-10-CM | POA: Diagnosis present

## 2024-02-14 DIAGNOSIS — Z91040 Latex allergy status: Secondary | ICD-10-CM

## 2024-02-14 DIAGNOSIS — Z79899 Other long term (current) drug therapy: Secondary | ICD-10-CM | POA: Diagnosis not present

## 2024-02-14 LAB — BETA-HYDROXYBUTYRIC ACID: Beta-Hydroxybutyric Acid: 0.09 mmol/L (ref 0.05–0.27)

## 2024-02-14 LAB — HEMOGLOBIN A1C
Hgb A1c MFr Bld: 4.7 % — ABNORMAL LOW (ref 4.8–5.6)
Mean Plasma Glucose: 88.19 mg/dL

## 2024-02-14 LAB — CBG MONITORING, ED
Glucose-Capillary: 103 mg/dL — ABNORMAL HIGH (ref 70–99)
Glucose-Capillary: 84 mg/dL (ref 70–99)
Glucose-Capillary: 82 mg/dL (ref 70–99)
Glucose-Capillary: 107 mg/dL — ABNORMAL HIGH (ref 70–99)

## 2024-02-14 LAB — GLUCOSE, CAPILLARY
Glucose-Capillary: 104 mg/dL — ABNORMAL HIGH (ref 70–99)
Glucose-Capillary: 106 mg/dL — ABNORMAL HIGH (ref 70–99)

## 2024-02-14 LAB — CBC WITH DIFFERENTIAL/PLATELET
Abs Immature Granulocytes: 0.01 10*3/uL (ref 0.00–0.07)
Basophils Absolute: 0.1 10*3/uL (ref 0.0–0.1)
Basophils Relative: 1 %
Eosinophils Absolute: 0.1 10*3/uL (ref 0.0–0.5)
Eosinophils Relative: 2 %
HCT: 34.9 % — ABNORMAL LOW (ref 36.0–46.0)
Hemoglobin: 12 g/dL (ref 12.0–15.0)
Immature Granulocytes: 0 %
Lymphocytes Relative: 29 %
Lymphs Abs: 1.8 10*3/uL (ref 0.7–4.0)
MCH: 30.1 pg (ref 26.0–34.0)
MCHC: 34.4 g/dL (ref 30.0–36.0)
MCV: 87.5 fL (ref 80.0–100.0)
Monocytes Absolute: 0.5 10*3/uL (ref 0.1–1.0)
Monocytes Relative: 8 %
Neutro Abs: 3.7 10*3/uL (ref 1.7–7.7)
Neutrophils Relative %: 60 %
Platelets: 248 10*3/uL (ref 150–400)
RBC: 3.99 MIL/uL (ref 3.87–5.11)
RDW: 12.4 % (ref 11.5–15.5)
WBC: 6.3 10*3/uL (ref 4.0–10.5)
nRBC: 0 % (ref 0.0–0.2)

## 2024-02-14 LAB — COMPREHENSIVE METABOLIC PANEL
ALT: 44 U/L (ref 0–44)
AST: 37 U/L (ref 15–41)
Albumin: 3.9 g/dL (ref 3.5–5.0)
Alkaline Phosphatase: 62 U/L (ref 38–126)
Anion gap: 4 — ABNORMAL LOW (ref 5–15)
BUN: 14 mg/dL (ref 6–20)
CO2: 29 mmol/L (ref 22–32)
Calcium: 9.1 mg/dL (ref 8.9–10.3)
Chloride: 105 mmol/L (ref 98–111)
Creatinine, Ser: 0.85 mg/dL (ref 0.44–1.00)
GFR, Estimated: >60 mL/min (ref >60)
Glucose, Bld: 66 mg/dL — ABNORMAL LOW (ref 70–99)
Potassium: 3.6 mmol/L (ref 3.5–5.1)
Sodium: 138 mmol/L (ref 135–145)
Total Bilirubin: 0.3 mg/dL (ref 0.0–1.2)
Total Protein: 6.8 g/dL (ref 6.5–8.1)

## 2024-02-14 LAB — TSH: TSH: 2.612 u[IU]/mL (ref 0.350–4.500)

## 2024-02-14 LAB — CORTISOL: Cortisol, Plasma: 1.9 ug/dL

## 2024-02-14 MED ORDER — BUPRENORPHINE HCL 8 MG SL SUBL
8.0000 mg | SUBLINGUAL_TABLET | Freq: Two times a day (BID) | SUBLINGUAL | Status: DC
  Administered 2024-02-14 – 2024-02-17 (×6): 8 mg via SUBLINGUAL
  Filled 2024-02-14 (×6): qty 1

## 2024-02-14 MED ORDER — DEXTROSE 50 % IV SOLN
1.0000 | Freq: Once | INTRAVENOUS | Status: DC
  Filled 2024-02-14 (×2): qty 50

## 2024-02-14 MED ORDER — PANTOPRAZOLE SODIUM 40 MG PO TBEC
40.0000 mg | DELAYED_RELEASE_TABLET | Freq: Every day | ORAL | Status: DC
  Administered 2024-02-15 – 2024-02-17 (×3): 40 mg via ORAL
  Filled 2024-02-14 (×3): qty 1

## 2024-02-14 MED ORDER — ENOXAPARIN SODIUM 40 MG/0.4ML IJ SOSY
40.0000 mg | PREFILLED_SYRINGE | INTRAMUSCULAR | Status: DC
  Administered 2024-02-14 – 2024-02-16 (×3): 40 mg via SUBCUTANEOUS
  Filled 2024-02-14 (×3): qty 0.4

## 2024-02-14 MED ORDER — SODIUM CHLORIDE 0.9% FLUSH
3.0000 mL | Freq: Two times a day (BID) | INTRAVENOUS | Status: DC
  Administered 2024-02-14 – 2024-02-17 (×6): 3 mL via INTRAVENOUS

## 2024-02-14 MED ORDER — COSYNTROPIN 0.25 MG IJ SOLR
0.2500 mg | Freq: Once | INTRAMUSCULAR | Status: AC
  Administered 2024-02-15: 0.25 mg via INTRAVENOUS
  Filled 2024-02-14: qty 0.25

## 2024-02-14 MED ORDER — ACETAMINOPHEN 650 MG RE SUPP: 650.0000 mg | Freq: Four times a day (QID) | RECTAL | Status: DC | PRN

## 2024-02-14 MED ORDER — DEXTROSE 10 % IV SOLN: INTRAVENOUS | Status: DC

## 2024-02-14 MED ORDER — QUETIAPINE FUMARATE 25 MG PO TABS
25.0000 mg | ORAL_TABLET | Freq: Every day | ORAL | Status: AC
  Administered 2024-02-14: 25 mg via ORAL
  Filled 2024-02-14: qty 1

## 2024-02-14 MED ORDER — PROCHLORPERAZINE EDISYLATE 10 MG/2ML IJ SOLN: 5.0000 mg | Freq: Four times a day (QID) | INTRAMUSCULAR | Status: DC | PRN

## 2024-02-14 MED ORDER — ACETAMINOPHEN 325 MG PO TABS: 650.0000 mg | ORAL_TABLET | Freq: Four times a day (QID) | ORAL | Status: DC | PRN

## 2024-02-14 MED ORDER — SENNOSIDES-DOCUSATE SODIUM 8.6-50 MG PO TABS: 1.0000 | ORAL_TABLET | Freq: Every evening | ORAL | Status: DC | PRN

## 2024-02-14 NOTE — ED Provider Notes (Signed)
 [place] EMERGENCY DEPARTMENT AT [facility] Provider Note  CSN: [id number] Arrival date & time: 02/14/24 1117  Chief Complaint(s) Hypoglycemia  HPI Linda [name] is a 38 y.o. female with PMH substance abuse, Hashimoto's thyroiditis, previous PE not on anticoagulation who presents emergency room for evaluation of hypoglycemia.  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  Her coworkers checked her blood sugar and found it to be 45.  Was given oral glucose repletion at the nursing facility and with EMS and on arrival patient's blood sugar is 162.  She currently endorses some mild lightheadedness but denies chest pain, shortness of breath, Donnell pain, nausea, vomiting or other systemic symptoms.  Patient is not diabetic and does not use insulin.   Past Medical History Past Medical History:  Diagnosis Date   Cardiomyopathy (HCC)    Fibromyalgia    Hashimoto's disease    Polysubstance abuse (HCC)    Pulmonary embolism (HCC)    Thyroid disease    Patient Active Problem List   Diagnosis Date Noted   Hypoglycemia 02/14/2024   Substance abuse (HCC) 11/07/2022   Posttraumatic stress disorder 01/25/2020   Polysubstance dependence (HCC) 01/25/2020   Home Medication(s) Prior to Admission medications   Medication Sig Start Date End Date Taking? Authorizing Provider  acetaminophen (TYLENOL) 500 MG tablet Take 500-1,000 mg by mouth every 6 (six) hours as needed for mild pain or moderate pain.    [provider]  ibuprofen (ADVIL) 200 MG tablet Take 200-800 mg by mouth every 6 (six) hours as needed for mild pain or moderate pain.    [provider]  pantoprazole (PROTONIX) 40 MG tablet Take 1 tablet (40 mg total) by mouth daily. 01/10/24   Geoffery Lyons, MD                                                                                                                                    Past Surgical History Past Surgical History:   Procedure Laterality Date   TONSILLECTOMY     Family History Family History  Family history unknown: Yes    Social History Social History   Tobacco Use   Smoking status: Every Day    Current packs/day: 0.50    Types: Cigarettes   Smokeless tobacco: Never  Vaping Use   Vaping status: Never Used  Substance Use Topics   Alcohol use: Never   Drug use: Yes    Types: Methamphetamines, Marijuana    Comment: IV drug use   Allergies Latex  Review of Systems Review of Systems  Neurological:  Positive for light-headedness.    Physical Exam Vital Signs  I have reviewed the triage vital signs BP 109/70 (BP Location: Right Arm)   Pulse 70   Resp 18   Ht 5\' 7"  (1.702 m)   Wt 70.3 kg   SpO2 99%   BMI 24.28 kg/m   Physical  Exam Vitals and nursing note reviewed.  Constitutional:      General: She is not in acute distress.    Appearance: She is well-developed.  HENT:     Head: Normocephalic and atraumatic.  Eyes:     Conjunctiva/sclera: Conjunctivae normal.  Cardiovascular:     Rate and Rhythm: Normal rate and regular rhythm.     Heart sounds: No murmur heard. Pulmonary:     Effort: Pulmonary effort is normal. No respiratory distress.     Breath sounds: Normal breath sounds.  Abdominal:     Palpations: Abdomen is soft.     Tenderness: There is no abdominal tenderness.  Musculoskeletal:        General: No swelling.     Cervical back: Neck supple.  Skin:    General: Skin is warm and dry.     Capillary Refill: Capillary refill takes less than 2 seconds.  Neurological:     Mental Status: She is alert.  Psychiatric:        Mood and Affect: Mood normal.     ED Results and Treatments Labs (all labs ordered are listed, but only abnormal results are displayed) Labs Reviewed  COMPREHENSIVE METABOLIC PANEL - Abnormal; Notable for the following components:      Result Value   Glucose, Bld 66 (*)    Anion gap 4 (*)    All other components within normal limits   CBC WITH DIFFERENTIAL/PLATELET - Abnormal; Notable for the following components:   HCT 34.9 (*)    All other components within normal limits  CBG MONITORING, ED - Abnormal; Notable for the following components:   Glucose-Capillary 103 (*)    All other components within normal limits  C-PEPTIDE  PROINSULIN/INSULIN RATIO  SULFONYLUREA HYPOGLYCEMICS PANEL, SERUM  CORTISOL  CBG MONITORING, ED  CBG MONITORING, ED                                                                                                                          Radiology No results found.  Pertinent labs & imaging results that were available during my care of the patient were reviewed by me and considered in my medical decision making (see MDM for details).  Medications Ordered in ED Medications  dextrose 50 % solution 50 mL (0 mLs Intravenous Hold 02/14/24 1426)  dextrose 10 % infusion ( Intravenous New Bag/Given 02/14/24 1438)  Procedures .Critical Care  Performed by: Glendora Score, MD Authorized by: Glendora Score, MD   Critical care provider statement:    Critical care time (minutes):  30   Critical care was necessary to treat or prevent imminent or life-threatening deterioration of the following conditions:  Endocrine crisis   Critical care was time spent personally by me on the following activities:  Development of treatment plan with patient or surrogate, discussions with consultants, evaluation of patient's response to treatment, examination of patient, ordering and review of laboratory studies, ordering and review of radiographic studies, ordering and performing treatments and interventions, pulse oximetry, re-evaluation of patient's condition and review of old charts   (including critical care time)  Medical Decision Making / ED Course   This patient presents to the ED  for concern of low blood sugar, this involves an extensive number of treatment options, and is a complaint that carries with it a high risk of complications and morbidity.  The differential diagnosis includes decreased p.o. intake, exogenous insulin use, alcohol use, insulinoma  MDM: Patient seen emergency room for evaluation of hyperglycemia.  Physical exam is unremarkable and patient is alert and oriented answering all questions appropriately.  Blood sugar on arrival reportedly in the 160s, our initial POC glucose 103.  Patient started on oral glucose replacement while in the emergency room.  Subsequent CMP testing shows glucose of 66 and patient started on a D10 infusion.  I initially attempted to admit the patient to the hospitalist but they are requesting a consultation and possible transfer to [facility] given no endocrine coverage at [facility].  I called [facility] system who had the transfer center informed me "they do not take outside endocrine consults" despite being the tertiary care center for the health system.  Patient then admitted to the [facility] for persistent unexplained hypoglycemia.   Additional history obtained:  -External records from outside source obtained and reviewed including: Chart review including previous notes, labs, imaging, consultation notes   Lab Tests: -I ordered, reviewed, and interpreted labs.   The pertinent results include:   Labs Reviewed  COMPREHENSIVE METABOLIC PANEL - Abnormal; Notable for the following components:      Result Value   Glucose, Bld 66 (*)    Anion gap 4 (*)    All other components within normal limits  CBC WITH DIFFERENTIAL/PLATELET - Abnormal; Notable for the following components:   HCT 34.9 (*)    All other components within normal limits  CBG MONITORING, ED - Abnormal; Notable for the following components:   Glucose-Capillary 103 (*)    All other components within normal limits  C-PEPTIDE  PROINSULIN/INSULIN RATIO   SULFONYLUREA HYPOGLYCEMICS PANEL, SERUM  CORTISOL  CBG MONITORING, ED  CBG MONITORING, ED     Medicines ordered and prescription drug management: Meds ordered this encounter  Medications   dextrose 50 % solution 50 mL   dextrose 10 % infusion    -I have reviewed the patients home medicines and have made adjustments as needed  Critical interventions D10 infusion  Cardiac Monitoring: The patient was maintained on a cardiac monitor.  I personally viewed and interpreted the cardiac monitored which showed an underlying rhythm of: NSR  Social Determinants of Health:  Factors impacting patients care include: Previous history of polysubstance abuse, currently no longer drinking or using drugs   Reevaluation: After the interventions noted above, I reevaluated the patient and found that they have :improved  Co morbidities that complicate the patient evaluation  Past  Medical History:  Diagnosis Date   Cardiomyopathy [facility])    Fibromyalgia    Hashimoto's disease    Polysubstance abuse (HCC)    Pulmonary embolism (HCC)    Thyroid disease       Dispostion: I considered admission for this patient, and patient will require hospital admission for persistent unexplained downtrending hyperglycemia     Final Clinical Impression(s) / ED Diagnoses Final diagnoses:  Hypoglycemia     @PCDICTATION @    Glendora Score, MD 02/14/24 1826

## 2024-02-14 NOTE — ED Notes (Signed)
 Called Thomas at [employer] for transport 17:57

## 2024-02-14 NOTE — ED Triage Notes (Signed)
 Pt bib by [facility] from work where she had a dizzy spell, no syncope. Coworkers checked CBG and noted 45, so food was given. EMS CBG 162. Pt ambulated from ambulance bay to rm 14 w/o assistance. CAOx4. NAD

## 2024-02-14 NOTE — H&P (Signed)
 History and Physical    Linda [name] ZOX:[id number] DOB: 1986-07-07 DOA: 02/14/2024  PCP: Patient, No Pcp Per   Patient coming from: Home   Chief Complaint: Lightheaded, sweaty, tremulous, hypoglycemia   HPI: Linda [name] is a 38 y.o. female with medical history significant for polysubstance abuse in remission, Hashimoto thyroiditis, and chronic hepatitis C who presents for evaluation of symptomatic hypoglycemia.  Patient reports that she has had recurrent episodes over the last couple months that involve lightheadedness, sweats, and tremors.  This has been occurring roughly once every 1 to 2 weeks.  She had an episode at work today and CBG was reportedly 45. She was given food, symptoms resolved, and CBG improved to 162.  She denies any history of DM, bariatric surgery, recent drug use or alcohol binge, or possibility that she inadvertently took someone else's medication.  [facility] ED Course: Upon arrival to the ED, patient is found to be afebrile and saturating well on room air with normal heart rate and stable blood pressure.  Labs are most notable for serum glucose 66.  Patient was treated with IV dextrose in the ED and transferred to [facility] for admission.  Review of Systems:  All other systems reviewed and apart from HPI, are negative.  Past Medical History:  Diagnosis Date   Cardiomyopathy (HCC)    Fibromyalgia    Hashimoto's disease    Polysubstance abuse (HCC)    Pulmonary embolism (HCC)    Thyroid disease     Past Surgical History:  Procedure Laterality Date   TONSILLECTOMY      Social History:   reports that she has been smoking cigarettes. She has never used smokeless tobacco. She reports current drug use. Drugs: Methamphetamines and Marijuana. She reports that she does not drink alcohol.  Allergies  Allergen Reactions   Latex Rash    Family History  Family history unknown: Yes     Prior to Admission medications    Medication Sig Start Date End Date Taking? Authorizing Provider  acetaminophen (TYLENOL) 500 MG tablet Take 500-1,000 mg by mouth every 6 (six) hours as needed for mild pain or moderate pain.    [provider]  ibuprofen (ADVIL) 200 MG tablet Take 200-800 mg by mouth every 6 (six) hours as needed for mild pain or moderate pain.    [provider]  pantoprazole (PROTONIX) 40 MG tablet Take 1 tablet (40 mg total) by mouth daily. 01/10/24   Geoffery Lyons, MD    Physical Exam: Vitals:   02/14/24 1132 02/14/24 1215 02/14/24 1300 02/14/24 1935  BP:  121/87 109/70 (!) 141/94  Pulse:  85 70 73  Resp:   18 18  Temp:    98.2 F (36.8 C)  SpO2:  97% 99% 100%  Weight: 70.3 kg     Height: 5\' 7"  (1.702 m)       Constitutional: NAD, calm  Eyes: PERTLA, lids and conjunctivae normal ENMT: Mucous membranes are moist. Posterior pharynx clear of any exudate or lesions.   Neck: supple, no masses  Respiratory: no wheezing, no crackles. No accessory muscle use.  Cardiovascular: S1 & S2 heard, regular rate and rhythm. No extremity edema.   Abdomen: No tenderness, soft. Bowel sounds active.  Musculoskeletal: no clubbing / cyanosis. No joint deformity upper and lower extremities.   Skin: no significant rashes, lesions, ulcers. Warm, dry, well-perfused. Neurologic: CN 2-12 grossly intact. Moving all extremities. Alert and oriented.  Psychiatric: Pleasant. Cooperative.    Labs and Imaging  on Admission: I have personally reviewed following labs and imaging studies  CBC: Recent Labs  Lab 02/14/24 1215  WBC 6.3  NEUTROABS 3.7  HGB 12.0  HCT 34.9*  MCV 87.5  PLT 248   Basic Metabolic Panel: Recent Labs  Lab 02/14/24 1215  NA 138  K 3.6  CL 105  CO2 29  GLUCOSE 66*  BUN 14  CREATININE 0.85  CALCIUM 9.1   GFR: Estimated Creatinine Clearance: 86.4 mL/min (by C-G formula based on SCr of 0.85 mg/dL). Liver Function Tests: Recent Labs  Lab 02/14/24 1215  AST 37  ALT 44   ALKPHOS 62  BILITOT 0.3  PROT 6.8  ALBUMIN 3.9   No results for input(s): "LIPASE", "AMYLASE" in the last 168 hours. No results for input(s): "AMMONIA" in the last 168 hours. Coagulation Profile: No results for input(s): "INR", "PROTIME" in the last 168 hours. Cardiac Enzymes: No results for input(s): "CKTOTAL", "CKMB", "CKMBINDEX", "TROPONINI" in the last 168 hours. BNP (last 3 results) No results for input(s): "PROBNP" in the last 8760 hours. HbA1C: No results for input(s): "HGBA1C" in the last 72 hours. CBG: Recent Labs  Lab 02/14/24 1149 02/14/24 1310 02/14/24 1406 02/14/24 1847 02/14/24 1957  GLUCAP 103* 84 82 107* 104*   Lipid Profile: No results for input(s): "CHOL", "HDL", "LDLCALC", "TRIG", "CHOLHDL", "LDLDIRECT" in the last 72 hours. Thyroid Function Tests: No results for input(s): "TSH", "T4TOTAL", "FREET4", "T3FREE", "THYROIDAB" in the last 72 hours. Anemia Panel: No results for input(s): "VITAMINB12", "FOLATE", "FERRITIN", "TIBC", "IRON", "RETICCTPCT" in the last 72 hours. Urine analysis:    Component Value Date/Time   COLORURINE YELLOW 01/09/2024 2007   APPEARANCEUR CLEAR 01/09/2024 2007   LABSPEC 1.038 (H) 01/09/2024 2007   PHURINE 6.5 01/09/2024 2007   GLUCOSEU NEGATIVE 01/09/2024 2007   HGBUR SMALL (A) 01/09/2024 2007   BILIRUBINUR NEGATIVE 01/09/2024 2007   KETONESUR NEGATIVE 01/09/2024 2007   PROTEINUR TRACE (A) 01/09/2024 2007   NITRITE POSITIVE (A) 01/09/2024 2007   LEUKOCYTESUR NEGATIVE 01/09/2024 2007   Sepsis Labs: @LABRCNTIP (procalcitonin:4,lacticidven:4) )No results found for this or any previous visit (from the past 240 hours).   Radiological Exams on Admission: No results found.   Assessment/Plan   1. Hypoglycemia in non-diabetic  - Pt had symptoms consistent with hypoglycemia, documented hypoglycemia, and resolution of symptoms with treatment  - She denies hx of DM, bariatric surgery, recent alcohol or drug use, or possibility  of inadvertently taking family member's diabetes medication - Check ACTH stim test, insulin and proinsulin levels, c-peptide, BHOB, and oral hypoglycemic screening panel, check CBGs q4h and as-needed    2. Chronic hepatitis C  - Recently referred for treatment    3. Polysubstance abuse  - Patient reports years of abstinence     DVT prophylaxis: Lovenox  Code Status: Full  Level of Care: Level of care: Telemetry Family Communication: none present  Disposition Plan:  Patient is from: home  Anticipated d/c is to: Home  Anticipated d/c date is: Possibly as early as 02/15/24  Patient currently: Pending workup, stable glucose   Consults called: Non e Admission status: Observation     Briscoe Deutscher, MD [facility]  02/14/2024, 8:07 PM

## 2024-02-15 ENCOUNTER — Observation Stay (HOSPITAL_COMMUNITY): Payer: MEDICAID

## 2024-02-15 DIAGNOSIS — E162 Hypoglycemia, unspecified: Secondary | ICD-10-CM | POA: Diagnosis not present

## 2024-02-15 LAB — RAPID URINE DRUG SCREEN, HOSP PERFORMED
Amphetamines: NOT DETECTED
Barbiturates: NOT DETECTED
Benzodiazepines: NOT DETECTED
Cocaine: NOT DETECTED
Opiates: NOT DETECTED
Tetrahydrocannabinol: POSITIVE — AB

## 2024-02-15 LAB — BASIC METABOLIC PANEL
Anion gap: 5 (ref 5–15)
BUN: 17 mg/dL (ref 6–20)
CO2: 23 mmol/L (ref 22–32)
Calcium: 8.2 mg/dL — ABNORMAL LOW (ref 8.9–10.3)
Chloride: 106 mmol/L (ref 98–111)
Creatinine, Ser: 0.66 mg/dL (ref 0.44–1.00)
GFR, Estimated: >60 mL/min (ref >60)
Glucose, Bld: 88 mg/dL (ref 70–99)
Potassium: 4 mmol/L (ref 3.5–5.1)
Sodium: 134 mmol/L — ABNORMAL LOW (ref 135–145)

## 2024-02-15 LAB — CBC
HCT: 37.9 % (ref 36.0–46.0)
Hemoglobin: 12.6 g/dL (ref 12.0–15.0)
MCH: 29.8 pg (ref 26.0–34.0)
MCHC: 33.2 g/dL (ref 30.0–36.0)
MCV: 89.6 fL (ref 80.0–100.0)
Platelets: 248 10*3/uL (ref 150–400)
RBC: 4.23 MIL/uL (ref 3.87–5.11)
RDW: 12.5 % (ref 11.5–15.5)
WBC: 4 10*3/uL (ref 4.0–10.5)
nRBC: 0 % (ref 0.0–0.2)

## 2024-02-15 LAB — ACTH STIMULATION, 3 TIME POINTS
Cortisol, 30 Min: 11.7 ug/dL
Cortisol, 60 Min: 11.3 ug/dL
Cortisol, Base: 3.1 ug/dL

## 2024-02-15 LAB — HIV ANTIBODY (ROUTINE TESTING W REFLEX): HIV Screen 4th Generation wRfx: NONREACTIVE

## 2024-02-15 LAB — GLUCOSE, CAPILLARY
Glucose-Capillary: 89 mg/dL (ref 70–99)
Glucose-Capillary: 94 mg/dL (ref 70–99)
Glucose-Capillary: 97 mg/dL (ref 70–99)
Glucose-Capillary: 104 mg/dL — ABNORMAL HIGH (ref 70–99)
Glucose-Capillary: 120 mg/dL — ABNORMAL HIGH (ref 70–99)

## 2024-02-15 MED ORDER — HYDROCORTISONE 10 MG PO TABS
10.0000 mg | ORAL_TABLET | Freq: Every day | ORAL | Status: DC
  Administered 2024-02-15 – 2024-02-17 (×3): 10 mg via ORAL
  Filled 2024-02-15 (×3): qty 1

## 2024-02-15 MED ORDER — QUETIAPINE FUMARATE 25 MG PO TABS
25.0000 mg | ORAL_TABLET | Freq: Every day | ORAL | Status: DC
Start: 2024-02-15 — End: 2024-02-17
  Administered 2024-02-15 – 2024-02-16 (×2): 25 mg via ORAL
  Filled 2024-02-15 (×2): qty 1

## 2024-02-15 MED ORDER — GADOBUTROL 1 MMOL/ML IV SOLN
7.0000 mL | Freq: Once | INTRAVENOUS | Status: AC | PRN
  Administered 2024-02-15: 7 mL via INTRAVENOUS

## 2024-02-15 MED ORDER — LORAZEPAM 2 MG/ML IJ SOLN
0.5000 mg | Freq: Once | INTRAMUSCULAR | Status: AC | PRN
  Administered 2024-02-15: 0.5 mg via INTRAVENOUS
  Filled 2024-02-15: qty 1

## 2024-02-15 MED ORDER — HYDROCORTISONE 5 MG PO TABS
5.0000 mg | ORAL_TABLET | Freq: Every day | ORAL | Status: DC
  Administered 2024-02-15 – 2024-02-16 (×2): 5 mg via ORAL
  Filled 2024-02-15 (×3): qty 1

## 2024-02-15 NOTE — Progress Notes (Signed)
 Transportation resources added to AVS Encouraged patient to call PCP to schedule hospital follow-up appointment.  02/15/24 1543  TOC Brief Assessment  Insurance and Status Reviewed  Patient has primary care physician Yes [facility])  Home environment has been reviewed Lives in a house with Aunt and Uncle  Prior level of function: Independent  Prior/Current Home Services No current home services  Social Drivers of Health Review SDOH reviewed interventions complete  Readmission risk has been reviewed Yes  Transition of care needs no transition of care needs at this time

## 2024-02-15 NOTE — Progress Notes (Signed)
 PROGRESS NOTE    Linda [name]  WUJ:[id number] DOB: 1986/02/18 DOA: 02/14/2024 PCP: Medicine, [facility]  Chief Complaint  Patient presents with   Hypoglycemia    Brief Narrative:   38 y.o. female with medical history significant for polysubstance abuse in remission, Hashimoto thyroiditis, and chronic hepatitis C who presents for evaluation of symptomatic hypoglycemia.   Assessment & Plan:   Principal Problem:   Hypoglycemia Active Problems:   Chronic hepatitis C without hepatic coma (HCC)   History of substance abuse (HCC)  Hypoglycemia due to Adrenal Insufficiency For past few months she's had intermittent episodes of lightheadedness, diaphoresis, feeling like she's going to pass out.  BG noted to be in 40's prior to this admission. Denies any insulin or sulfonylurea use, not using meds that aren't hers Cortisol < 3 consistent with adrenal insufficiency.  ACTH stim test is also notable for adrenal insufficiency.  Follow AM ACTH and cortisol (prior to AM dose of hydrocortisone) Hydrocortisone 10 mg qam, 5 mg qpm She notes HA, intermittent vision changes, will get MRI brain to r/o pituitary lesion Needs endocrine follow up outpatient Normal beta hydroxybutyric acid.  TSH wnl. Pending sulfonylurea hypoglycemic panel. Pending C peptide, pending random insulin, pending pronisulin/insulin ratio.  A1c 4.7.  Chronic Hepatitis C Recently referred for treatment  Polysubstance abuse On subutex     DVT prophylaxis: lovenox Code Status: full Family Communication: none Disposition:   Status is: Observation The patient remains OBS appropriate and will d/c before 2 midnights.   Consultants:  none  Procedures:  none  Antimicrobials:  Anti-infectives (From admission, onward)    None       Subjective: 3-4 months of intermittent LH, sweaty, feeling like she's going to pass out  Objective: Vitals:   02/14/24 1935 02/14/24 2330 02/15/24 0732  02/15/24 1310  BP: (!) 141/94 113/82 92/69 115/75  Pulse: 73 60 69 70  Resp: 18 18 17 17   Temp: 98.2 F (36.8 C) 98.5 F (36.9 C) 97.9 F (36.6 C) 98.2 F (36.8 C)  TempSrc: Oral     SpO2: 100% 99% 97% 96%  Weight:      Height:        Intake/Output Summary (Last 24 hours) at 02/15/2024 1817 Last data filed at 02/15/2024 1327 Gross per 24 hour  Intake 597 ml  Output --  Net 597 ml   Filed Weights   02/14/24 1132  Weight: 70.3 kg    Examination:  General exam: Appears calm and comfortable  Respiratory system: unlabored Cardiovascular system: RRR Gastrointestinal system: Abdomen is nondistended, soft and nontender.  Central nervous system: Alert and oriented. EOMI.  Visual fields intact. Moving all extremities. Extremities: no LEE   Data Reviewed: I have personally reviewed following labs and imaging studies  CBC: Recent Labs  Lab 02/14/24 1215 02/15/24 0853  WBC 6.3 4.0  NEUTROABS 3.7  --   HGB 12.0 12.6  HCT 34.9* 37.9  MCV 87.5 89.6  PLT 248 248    Basic Metabolic Panel: Recent Labs  Lab 02/14/24 1215 02/15/24 0853  NA 138 134*  K 3.6 4.0  CL 105 106  CO2 29 23  GLUCOSE 66* 88  BUN 14 17  CREATININE 0.85 0.66  CALCIUM 9.1 8.2*    GFR: Estimated Creatinine Clearance: 91.8 mL/min (by C-G formula based on SCr of 0.66 mg/dL).  Liver Function Tests: Recent Labs  Lab 02/14/24 1215  AST 37  ALT 44  ALKPHOS 62  BILITOT 0.3  PROT 6.8  ALBUMIN 3.9    CBG: Recent Labs  Lab 02/14/24 2347 02/15/24 0420 02/15/24 0733 02/15/24 1149 02/15/24 1618  GLUCAP 106* 89 94 97 104*     No results found for this or any previous visit (from the past 240 hours).       Radiology Studies: No results found.      Scheduled Meds:  buprenorphine  8 mg Sublingual BID   enoxaparin (LOVENOX) injection  40 mg Subcutaneous Q24H   hydrocortisone  10 mg Oral Daily   And   hydrocortisone  5 mg Oral QHS   pantoprazole  40 mg Oral Daily   sodium  chloride flush  3 mL Intravenous Q12H   Continuous Infusions:   LOS: 0 days    Time spent: over 30 min    Lacretia Nicks, MD [facility]   To contact the attending provider between 7A-7P or the covering provider during after hours 7P-7A, please log into the web site [email or web address] and access using universal [place] password for that web site. If you do not have the password, please call the hospital operator.  02/15/2024, 6:17 PM

## 2024-02-15 NOTE — Discharge Instructions (Signed)
 Transportation Assistance MEDICAID provided by: DEPARTMENT OF SOCIAL SERVICES - [facility] [address], [place], [place] transportation is available to [place] recipients who need assistance getting to [facility] medical appointments and providers. Public Assistance ProgramsTransportation

## 2024-02-15 NOTE — Plan of Care (Signed)
 VSS. No c/o pain. BG stable throughout shift. LBM 3/17. No acute events overnight.  Problem: Education: Goal: Knowledge of General Education information will improve Description: Including pain rating scale, medication(s)/side effects and non-pharmacologic comfort measures Outcome: Progressing   Problem: Clinical Measurements: Goal: Ability to maintain clinical measurements within normal limits will improve Outcome: Progressing Goal: Will remain free from infection Outcome: Progressing   Problem: Safety: Goal: Ability to remain free from injury will improve Outcome: Progressing   Problem: Metabolic: Goal: Ability to maintain appropriate glucose levels will improve Outcome: Progressing

## 2024-02-16 DIAGNOSIS — Z86711 Personal history of pulmonary embolism: Secondary | ICD-10-CM | POA: Diagnosis not present

## 2024-02-16 DIAGNOSIS — I429 Cardiomyopathy, unspecified: Secondary | ICD-10-CM | POA: Diagnosis present

## 2024-02-16 DIAGNOSIS — F17210 Nicotine dependence, cigarettes, uncomplicated: Secondary | ICD-10-CM | POA: Diagnosis present

## 2024-02-16 DIAGNOSIS — E162 Hypoglycemia, unspecified: Secondary | ICD-10-CM | POA: Diagnosis present

## 2024-02-16 DIAGNOSIS — E2740 Unspecified adrenocortical insufficiency: Secondary | ICD-10-CM

## 2024-02-16 DIAGNOSIS — K2980 Duodenitis without bleeding: Secondary | ICD-10-CM | POA: Diagnosis present

## 2024-02-16 DIAGNOSIS — Z91040 Latex allergy status: Secondary | ICD-10-CM | POA: Diagnosis not present

## 2024-02-16 DIAGNOSIS — E063 Autoimmune thyroiditis: Secondary | ICD-10-CM | POA: Diagnosis present

## 2024-02-16 DIAGNOSIS — B182 Chronic viral hepatitis C: Secondary | ICD-10-CM | POA: Diagnosis present

## 2024-02-16 DIAGNOSIS — Z79899 Other long term (current) drug therapy: Secondary | ICD-10-CM | POA: Diagnosis not present

## 2024-02-16 DIAGNOSIS — F1911 Other psychoactive substance abuse, in remission: Secondary | ICD-10-CM | POA: Diagnosis present

## 2024-02-16 DIAGNOSIS — M797 Fibromyalgia: Secondary | ICD-10-CM | POA: Diagnosis present

## 2024-02-16 LAB — MAGNESIUM: Magnesium: 2 mg/dL (ref 1.7–2.4)

## 2024-02-16 LAB — GLUCOSE, CAPILLARY
Glucose-Capillary: 100 mg/dL — ABNORMAL HIGH (ref 70–99)
Glucose-Capillary: 105 mg/dL — ABNORMAL HIGH (ref 70–99)
Glucose-Capillary: 111 mg/dL — ABNORMAL HIGH (ref 70–99)
Glucose-Capillary: 115 mg/dL — ABNORMAL HIGH (ref 70–99)
Glucose-Capillary: 87 mg/dL (ref 70–99)
Glucose-Capillary: 97 mg/dL (ref 70–99)
Glucose-Capillary: 98 mg/dL (ref 70–99)

## 2024-02-16 LAB — CBC
HCT: 38.4 % (ref 36.0–46.0)
Hemoglobin: 12.6 g/dL (ref 12.0–15.0)
MCH: 29.6 pg (ref 26.0–34.0)
MCHC: 32.8 g/dL (ref 30.0–36.0)
MCV: 90.4 fL (ref 80.0–100.0)
Platelets: 280 10*3/uL (ref 150–400)
RBC: 4.25 MIL/uL (ref 3.87–5.11)
RDW: 12.4 % (ref 11.5–15.5)
WBC: 5.6 10*3/uL (ref 4.0–10.5)
nRBC: 0 % (ref 0.0–0.2)

## 2024-02-16 LAB — BASIC METABOLIC PANEL
Anion gap: 7 (ref 5–15)
BUN: 18 mg/dL (ref 6–20)
CO2: 22 mmol/L (ref 22–32)
Calcium: 8.7 mg/dL — ABNORMAL LOW (ref 8.9–10.3)
Chloride: 107 mmol/L (ref 98–111)
Creatinine, Ser: 0.66 mg/dL (ref 0.44–1.00)
GFR, Estimated: >60 mL/min (ref >60)
Glucose, Bld: 98 mg/dL (ref 70–99)
Potassium: 3.6 mmol/L (ref 3.5–5.1)
Sodium: 136 mmol/L (ref 135–145)

## 2024-02-16 LAB — CORTISOL: Cortisol, Plasma: 6.7 ug/dL

## 2024-02-16 LAB — C-PEPTIDE: C-Peptide: 2.7 ng/mL (ref 1.1–4.4)

## 2024-02-16 LAB — INSULIN, RANDOM: Insulin: 11.9 u[IU]/mL (ref 2.6–24.9)

## 2024-02-16 LAB — PHOSPHORUS: Phosphorus: 3.7 mg/dL (ref 2.5–4.6)

## 2024-02-16 NOTE — Progress Notes (Signed)
 [facility]  PROGRESS NOTE  Linda [name] QMV:[id number] DOB: Oct 30, 1986 DOA: 02/14/2024 PCP: Medicine, [facility]   Brief HPI:   38 y.o. female with medical history significant for polysubstance abuse in remission, Hashimoto thyroiditis, and chronic hepatitis C who presents for evaluation of symptomatic hypoglycemia.       Assessment/Plan:   Hypoglycemia due to Adrenal Insufficiency For past few months she's had intermittent episodes of lightheadedness, diaphoresis, feeling like she's going to pass out.  BG noted to be in 40's prior to this admission. Denies any insulin or sulfonylurea use, not using meds that aren't hers Cortisol < 3 consistent with adrenal insufficiency.  ACTH stim test is also notable for adrenal insufficiency.  Started on hydrocortisone 10 mg qam, 5 mg qpm -This morning cortisol level is 6.7 She notes HA, intermittent vision changes, MRI brain was unremarkable  Needs endocrine follow up outpatient Normal beta hydroxybutyric acid.  TSH wnl. Pending sulfonylurea hypoglycemic panel. Pending C peptide, pending random insulin, pending pronisulin/insulin ratio.  A1c 4.7.   Chronic Hepatitis C Recently referred for treatment   Polysubstance abuse On subutex     Medications     buprenorphine  8 mg Sublingual BID   enoxaparin (LOVENOX) injection  40 mg Subcutaneous Q24H   hydrocortisone  10 mg Oral Daily   And   hydrocortisone  5 mg Oral QHS   pantoprazole  40 mg Oral Daily   QUEtiapine  25 mg Oral QHS   sodium chloride flush  3 mL Intravenous Q12H     Data Reviewed:   CBG:  Recent Labs  Lab 02/15/24 1618 02/15/24 1936 02/16/24 0051 02/16/24 0405 02/16/24 0733  GLUCAP 104* 120* 87 105* 100*    SpO2: 97 %    Vitals:   02/15/24 0732 02/15/24 1310 02/15/24 1933 02/16/24 0408  BP: 92/69 115/75 111/83 96/62  Pulse: 69 70 78 60  Resp: 17 17 19 16   Temp: 97.9 F (36.6 C) 98.2 F (36.8 C) 98 F (36.7 C) 98.3 F  (36.8 C)  TempSrc:      SpO2: 97% 96% 100% 97%  Weight:      Height:          Data Reviewed:  Basic Metabolic Panel: Recent Labs  Lab 02/14/24 1215 02/15/24 0853  NA 138 134*  K 3.6 4.0  CL 105 106  CO2 29 23  GLUCOSE 66* 88  BUN 14 17  CREATININE 0.85 0.66  CALCIUM 9.1 8.2*    CBC: Recent Labs  Lab 02/14/24 1215 02/15/24 0853 02/16/24 0810  WBC 6.3 4.0 5.6  NEUTROABS 3.7  --   --   HGB 12.0 12.6 12.6  HCT 34.9* 37.9 38.4  MCV 87.5 89.6 90.4  PLT 248 248 280    LFT Recent Labs  Lab 02/14/24 1215  AST 37  ALT 44  ALKPHOS 62  BILITOT 0.3  PROT 6.8  ALBUMIN 3.9     Antibiotics: Anti-infectives (From admission, onward)    None        DVT prophylaxis: Lovenox  Code Status: Full code  Family Communication: No family at bedside   CONSULTS     Subjective   Denies any complaints.   Objective    Physical Examination:   General-appears in no acute distress Heart-S1-S2, regular, no murmur auscultated Lungs-clear to auscultation bilaterally, no wheezing or crackles auscultated Abdomen-soft, nontender, no organomegaly Extremities-no edema in the lower extremities Neuro-alert, oriented x3, no focal deficit noted   Status is: Inpatient:  Meredeth Ide   [facility] If 7PM-7AM, please contact night-coverage at [email or web address], Office  [phone]   02/16/2024, 9:28 AM  LOS: 0 days

## 2024-02-16 NOTE — Plan of Care (Addendum)
 VSS. No c/o pain. BG stable throughout shift. MRI Brain completed, patient tolerated well. No acute events overnight.   Problem: Education: Goal: Knowledge of General Education information will improve Description: Including pain rating scale, medication(s)/side effects and non-pharmacologic comfort measures Outcome: Progressing   Problem: Clinical Measurements: Goal: Ability to maintain clinical measurements within normal limits will improve 02/16/2024 0350 by Edmund Hilda, RN Outcome: Progressing Goal: Will remain free from infection Outcome: Progressing   Problem: Safety: Goal: Ability to remain free from injury will improve Outcome: Progressing   Problem: Metabolic: Goal: Ability to maintain appropriate glucose levels will improve Outcome: Progressing

## 2024-02-17 ENCOUNTER — Inpatient Hospital Stay (HOSPITAL_COMMUNITY): Payer: MEDICAID

## 2024-02-17 DIAGNOSIS — E162 Hypoglycemia, unspecified: Secondary | ICD-10-CM | POA: Diagnosis not present

## 2024-02-17 DIAGNOSIS — E2740 Unspecified adrenocortical insufficiency: Secondary | ICD-10-CM | POA: Diagnosis not present

## 2024-02-17 LAB — GLUCOSE, CAPILLARY
Glucose-Capillary: 108 mg/dL — ABNORMAL HIGH (ref 70–99)
Glucose-Capillary: 92 mg/dL (ref 70–99)
Glucose-Capillary: 81 mg/dL (ref 70–99)
Glucose-Capillary: 86 mg/dL (ref 70–99)

## 2024-02-17 LAB — ACTH: C206 ACTH: 1.9 pg/mL — ABNORMAL LOW (ref 7.2–63.3)

## 2024-02-17 MED ORDER — HYDROCORTISONE 5 MG PO TABS: ORAL_TABLET | ORAL | 1 refills | Status: AC

## 2024-02-17 MED ORDER — BLOOD GLUCOSE TEST VI STRP: 1.0000 | ORAL_STRIP | Freq: Three times a day (TID) | 0 refills | Status: AC

## 2024-02-17 MED ORDER — PANTOPRAZOLE SODIUM 40 MG PO TBEC: 40.0000 mg | DELAYED_RELEASE_TABLET | Freq: Every day | ORAL | 2 refills | Status: AC

## 2024-02-17 MED ORDER — LANCETS MISC. MISC: 1.0000 | Freq: Three times a day (TID) | 0 refills | Status: AC

## 2024-02-17 MED ORDER — IOHEXOL 300 MG/ML  SOLN
30.0000 mL | Freq: Once | INTRAMUSCULAR | Status: AC | PRN
  Administered 2024-02-17: 30 mL via ORAL

## 2024-02-17 MED ORDER — BLOOD GLUCOSE MONITORING SUPPL DEVI: 1.0000 | Freq: Three times a day (TID) | 0 refills | Status: AC

## 2024-02-17 MED ORDER — LANCET DEVICE MISC: 1.0000 | Freq: Three times a day (TID) | 0 refills | Status: AC

## 2024-02-17 NOTE — Plan of Care (Signed)
  Problem: Education: Goal: Knowledge of General Education information will improve Description: Including pain rating scale, medication(s)/side effects and non-pharmacologic comfort measures Outcome: Progressing   Problem: Health Behavior/Discharge Planning: Goal: Ability to manage health-related needs will improve Outcome: Progressing   Problem: Clinical Measurements: Goal: Ability to maintain clinical measurements within normal limits will improve Outcome: Progressing Goal: Will remain free from infection Outcome: Progressing Goal: Diagnostic test results will improve Outcome: Progressing Goal: Respiratory complications will improve Outcome: Progressing Goal: Cardiovascular complication will be avoided Outcome: Progressing   Problem: Activity: Goal: Risk for activity intolerance will decrease Outcome: Progressing   Problem: Nutrition: Goal: Adequate nutrition will be maintained Outcome: Progressing   Problem: Coping: Goal: Level of anxiety will decrease Outcome: Progressing   Problem: Elimination: Goal: Will not experience complications related to bowel motility Outcome: Progressing Goal: Will not experience complications related to urinary retention Outcome: Progressing   Problem: Pain Managment: Goal: General experience of comfort will improve and/or be controlled Outcome: Progressing   Problem: Safety: Goal: Ability to remain free from injury will improve Outcome: Progressing   Problem: Skin Integrity: Goal: Risk for impaired skin integrity will decrease Outcome: Progressing   Problem: Metabolic: Goal: Ability to maintain appropriate glucose levels will improve Outcome: Progressing

## 2024-02-17 NOTE — Discharge Summary (Addendum)
 Physician Discharge Summary   Patient: Linda [name] MRN: [id number] DOB: 1985/11/30  Admit date:     02/14/2024  Discharge date: 02/17/24  Discharge Physician: Meredeth Ide   PCP: Medicine, [facility]   Recommendations at discharge:   Will need referral for endocrinology as outpatient for adrenal insufficiency Will need referral for gastroenterology outpatient for duodenitis Continue hydrocortisone tablets 10 mg in morning and 5 mg at bedtime Continue Protonix 40 mg daily Avoid NSAIDs, Motrin or Advil Follow-up proinsulin/insulin ratio  Discharge Diagnoses: Principal Problem:   Hypoglycemia Active Problems:   Chronic hepatitis C without hepatic coma (HCC)   History of substance abuse (HCC)   Adrenal insufficiency (HCC)  Resolved Problems:   * No resolved hospital problems. *  Hospital Course: 38 y.o. female with medical history significant for polysubstance abuse in remission, Hashimoto thyroiditis, and chronic hepatitis C who presents for evaluation of symptomatic hypoglycemia   Assessment and Plan:      Hypoglycemia due to Adrenal Insufficiency For past few months she's had intermittent episodes of lightheadedness, diaphoresis, feeling like she's going to pass out.  BG noted to be in 40's prior to this admission. Denies any insulin or sulfonylurea use, not using meds that aren't hers Cortisol < 3 consistent with adrenal insufficiency.  ACTH stim test is also notable for adrenal insufficiency.  Started on hydrocortisone 10 mg qam, 5 mg qpm -This morning cortisol level is 6.7 She notes HA, intermittent vision changes, MRI brain was unremarkable  Needs endocrine follow up outpatient Normal beta hydroxybutyric acid.  TSH wnl. Pending sulfonylurea hypoglycemic panel. Pending C peptide, pending random insulin, pending pronisulin/insulin ratio.  A1c 4.7. MRI brain was unremarkable Will send her with a glucometer and test strips   Chronic Hepatitis  C Recently referred for treatment   Polysubstance abuse On subutex       Consultants:  Procedures performed:  Disposition: Home Diet recommendation:  Discharge Diet Orders (From admission, onward)     Start     Ordered   02/17/24 0000  Diet - low sodium heart healthy        02/17/24 1619           Regular diet DISCHARGE MEDICATION: Allergies as of 02/17/2024       Reactions   Latex Rash        Medication List     STOP taking these medications    amoxicillin-clavulanate 875-125 MG tablet Commonly known as: AUGMENTIN   ibuprofen 200 MG tablet Commonly known as: ADVIL       TAKE these medications    Blood Glucose Monitoring Suppl Devi 1 each by Does not apply route in the morning, at noon, and at bedtime. May substitute to any manufacturer covered by patient's insurance.   BLOOD GLUCOSE TEST STRIPS Strp 1 each by In Vitro route in the morning, at noon, and at bedtime. May substitute to any manufacturer covered by patient's insurance.   buprenorphine 8 MG Subl SL tablet Commonly known as: SUBUTEX Place 8 mg under the tongue in the morning and at bedtime.   cetirizine 10 MG tablet Commonly known as: ZYRTEC Take by mouth.   gabapentin 300 MG capsule Commonly known as: NEURONTIN Take 300 mg by mouth daily as needed (nerve pain).   hydrocortisone 5 MG tablet Commonly known as: CORTEF Take 2 tablets (10 mg total) by mouth daily AND 1 tablet (5 mg total) at bedtime.   Lancet Device Misc 1 each by Does not apply  route in the morning, at noon, and at bedtime. May substitute to any manufacturer covered by patient's insurance.   Lancets Misc. Misc 1 each by Does not apply route in the morning, at noon, and at bedtime. May substitute to any manufacturer covered by patient's insurance.   pantoprazole 40 MG tablet Commonly known as: Protonix Take 1 tablet (40 mg total) by mouth daily.   QUEtiapine 25 MG tablet Commonly known as: SEROQUEL Take 25 mg by  mouth at bedtime.   topiramate 50 MG tablet Commonly known as: TOPAMAX Take 50 mg by mouth daily.        Follow-up Information     Medicine, [facility]. Schedule an appointment as soon as possible for a visit.   Specialty: Family Medicine Why: For a hospital follow-up               Discharge Exam: Filed Weights   02/14/24 1132  Weight: 70.3 kg   General-appears in no acute distress Heart-S1-S2, regular, no murmur auscultated Lungs-clear to auscultation bilaterally, no wheezing or crackles auscultated Abdomen-soft, nontender, no organomegaly Extremities-no edema in the lower extremities Neuro-alert, oriented x3, no focal deficit noted  Condition at discharge: good  The results of significant diagnostics from this hospitalization (including imaging, microbiology, ancillary and laboratory) are listed below for reference.   Imaging Studies: MR BRAIN W WO CONTRAST Result Date: 02/16/2024 CLINICAL DATA:  Initial evaluation for headache, visual disturbance, evaluate for pituitary abnormality. EXAM: MRI HEAD WITHOUT AND WITH CONTRAST TECHNIQUE: Multiplanar, multiecho pulse sequences of the brain and surrounding structures were obtained without and with intravenous contrast. A pituitary protocol was utilized. CONTRAST:  7mL GADAVIST GADOBUTROL 1 MMOL/ML IV SOLN COMPARISON:  None Available. FINDINGS: Brain: Cerebral volume within normal limits. No focal parenchymal signal abnormality or significant cerebral white matter disease. No evidence for acute or subacute infarct. Gray-white matter differentiation maintained. No areas of chronic cortical infarction. No acute or chronic intracranial blood products. No mass lesion within the brain itself. No mass effect or midline shift. No hydrocephalus or extra-axial fluid collection. No abnormal enhancement within the brain. Thin section dynamic imaging through the sella and pituitary gland was performed. Pituitary gland is  normal in size and morphology. Pituitary bright spot normally position. Pituitary stalk midline and intact. Following contrast administration, no areas of relative hypoenhancement to suggest pituitary adenoma or other abnormality. No abnormality about the adjacent cavernous sinus. Optic chiasm normally situated within the suprasellar cistern. Vascular: Major intracranial vascular flow voids are maintained. Skull and upper cervical spine: Craniocervical junction within normal limits. Bone marrow signal intensity normal. No scalp soft tissue abnormality. Sinuses/Orbits: Globes and orbital soft tissues within normal limits. Moderate mucosal thickening present about the right sphenoid sinus. Paranasal sinuses are otherwise clear. No significant mastoid effusion. Other: Few small retention cyst noted at the nasopharynx. IMPRESSION: 1. Normal MRI of the brain and pituitary gland. No pituitary lesion or other acute abnormality. 2. Moderate right sphenoid sinusitis. Electronically Signed   By: Rise Mu M.D.   On: 02/16/2024 05:59    Microbiology: Results for orders placed or performed during the hospital encounter of 01/06/24  Resp panel by RT-PCR (RSV, Flu A&B, Covid) Anterior Nasal Swab     Status: Abnormal   Collection Time: 01/06/24 12:01 PM   Specimen: Anterior Nasal Swab  Result Value Ref Range Status   SARS Coronavirus 2 by RT PCR NEGATIVE NEGATIVE Final    Comment: (NOTE) SARS-CoV-2 target nucleic acids are NOT DETECTED.  The SARS-CoV-2 RNA is generally detectable in upper respiratory specimens during the acute phase of infection. The lowest concentration of SARS-CoV-2 viral copies this assay can detect is 138 copies/mL. A negative result does not preclude SARS-Cov-2 infection and should not be used as the sole basis for treatment or other patient management decisions. A negative result may occur with  improper specimen collection/handling, submission of specimen other than  nasopharyngeal swab, presence of viral mutation(s) within the areas targeted by this assay, and inadequate number of viral copies(<138 copies/mL). A negative result must be combined with clinical observations, patient history, and epidemiological information. The expected result is Negative.  Fact Sheet for Patients:  BloggerCourse.com  Fact Sheet for Healthcare Providers:  SeriousBroker.it  This test is no t yet approved or cleared by the [place] FDA and  has been authorized for detection and/or diagnosis of SARS-CoV-2 by FDA under an Emergency Use Authorization (EUA). This EUA will remain  in effect (meaning this test can be used) for the duration of the COVID-19 declaration under Section 564(b)(1) of the Act, 21 U.S.C.section 360bbb-3(b)(1), unless the authorization is terminated  or revoked sooner.       Influenza A by PCR POSITIVE (A) NEGATIVE Final   Influenza B by PCR NEGATIVE NEGATIVE Final    Comment: (NOTE) The Xpert Xpress SARS-CoV-2/FLU/RSV plus assay is intended as an aid in the diagnosis of influenza from Nasopharyngeal swab specimens and should not be used as a sole basis for treatment. Nasal washings and aspirates are unacceptable for Xpert Xpress SARS-CoV-2/FLU/RSV testing.  Fact Sheet for Patients: BloggerCourse.com  Fact Sheet for Healthcare Providers: SeriousBroker.it  This test is not yet approved or cleared by the [place] FDA and has been authorized for detection and/or diagnosis of SARS-CoV-2 by FDA under an Emergency Use Authorization (EUA). This EUA will remain in effect (meaning this test can be used) for the duration of the COVID-19 declaration under Section 564(b)(1) of the Act, 21 U.S.C. section 360bbb-3(b)(1), unless the authorization is terminated or revoked.     Resp Syncytial Virus by PCR NEGATIVE NEGATIVE Final    Comment:  (NOTE) Fact Sheet for Patients: BloggerCourse.com  Fact Sheet for Healthcare Providers: SeriousBroker.it  This test is not yet approved or cleared by the [place] FDA and has been authorized for detection and/or diagnosis of SARS-CoV-2 by FDA under an Emergency Use Authorization (EUA). This EUA will remain in effect (meaning this test can be used) for the duration of the COVID-19 declaration under Section 564(b)(1) of the Act, 21 U.S.C. section 360bbb-3(b)(1), unless the authorization is terminated or revoked.  Performed at [facility], [address], [place], [place] [address]     Labs: CBC: Recent Labs  Lab 02/14/24 1215 02/15/24 0853 02/16/24 0810  WBC 6.3 4.0 5.6  NEUTROABS 3.7  --   --   HGB 12.0 12.6 12.6  HCT 34.9* 37.9 38.4  MCV 87.5 89.6 90.4  PLT 248 248 280   Basic Metabolic Panel: Recent Labs  Lab 02/14/24 1215 02/15/24 0853 02/16/24 0810  NA 138 134* 136  K 3.6 4.0 3.6  CL 105 106 107  CO2 29 23 22   GLUCOSE 66* 88 98  BUN 14 17 18   CREATININE 0.85 0.66 0.66  CALCIUM 9.1 8.2* 8.7*  MG  --   --  2.0  PHOS  --   --  3.7   Liver Function Tests: Recent Labs  Lab 02/14/24 1215  AST 37  ALT 44  ALKPHOS  62  BILITOT 0.3  PROT 6.8  ALBUMIN 3.9   CBG: Recent Labs  Lab 02/16/24 1948 02/16/24 2349 02/17/24 0436 02/17/24 0720 02/17/24 1138  GLUCAP 111* 98 108* 92 81    Discharge time spent: greater than 30 minutes.  Signed: Meredeth Ide, MD [facility] 02/17/2024

## 2024-02-17 NOTE — Plan of Care (Addendum)
 VSS. No c/o pain. BG stable throughout shift. LBM 3/21. No acute events overnight.   Problem: Education: Goal: Knowledge of General Education information will improve Description: Including pain rating scale, medication(s)/side effects and non-pharmacologic comfort measures Outcome: Progressing   Problem: Clinical Measurements: Goal: Ability to maintain clinical measurements within normal limits will improve Outcome: Progressing Goal: Will remain free from infection Outcome: Progressing   Problem: Coping: Goal: Level of anxiety will decrease Outcome: Progressing   Problem: Safety: Goal: Ability to remain free from injury will improve Outcome: Progressing   Problem: Metabolic: Goal: Ability to maintain appropriate glucose levels will improve Outcome: Progressing

## 2024-02-20 LAB — SULFONYLUREA HYPOGLYCEMICS PANEL, SERUM
Acetohexamide: NEGATIVE ug/mL (ref 20–60)
Chlorpropamide: NEGATIVE ug/mL (ref 75–250)
Glimepiride: NEGATIVE ng/mL (ref 80–250)
Glipizide: NEGATIVE ng/mL (ref 200–1000)
Glyburide: NEGATIVE ng/mL
Nateglinide: NEGATIVE ng/mL
Repaglinide: NEGATIVE ng/mL
Tolazamide: NEGATIVE ug/mL
Tolbutamide: NEGATIVE ug/mL (ref 40–100)

## 2024-02-21 LAB — SULFONYLUREA HYPOGLYCEMICS PANEL, SERUM
Acetohexamide: NEGATIVE ug/mL (ref 20–60)
Chlorpropamide: NEGATIVE ug/mL (ref 75–250)
Glimepiride: NEGATIVE ng/mL (ref 80–250)
Glipizide: NEGATIVE ng/mL (ref 200–1000)
Glyburide: NEGATIVE ng/mL
Nateglinide: NEGATIVE ng/mL
Repaglinide: NEGATIVE ng/mL
Tolazamide: NEGATIVE ug/mL
Tolbutamide: NEGATIVE ug/mL (ref 40–100)

## 2024-02-21 LAB — PROINSULIN/INSULIN RATIO
Insulin: 6 u[IU]/mL
Proinsulin: 2.1 pmol/L

## 2024-02-22 LAB — C-PEPTIDE: C-Peptide: 3.2 ng/mL (ref 1.1–4.4)

## 2024-02-23 LAB — PROINSULIN/INSULIN RATIO
Insulin: 6.8 u[IU]/mL
Proinsulin: 3.7 pmol/L
